# Patient Record
Sex: Male | Born: 1960 | Race: White | Hispanic: No | Marital: Married | Smoking: Current every day smoker
Health system: Southern US, Community
[De-identification: ages and names within clinical notes are randomized; demographics above are authoritative.]

## PROBLEM LIST (undated history)

## (undated) DIAGNOSIS — J449 Chronic obstructive pulmonary disease, unspecified: Secondary | ICD-10-CM

## (undated) DIAGNOSIS — D6851 Activated protein C resistance: Secondary | ICD-10-CM

## (undated) DIAGNOSIS — E785 Hyperlipidemia, unspecified: Secondary | ICD-10-CM

## (undated) DIAGNOSIS — F0150 Vascular dementia without behavioral disturbance: Secondary | ICD-10-CM

## (undated) DIAGNOSIS — I10 Essential (primary) hypertension: Secondary | ICD-10-CM

## (undated) DIAGNOSIS — F418 Other specified anxiety disorders: Secondary | ICD-10-CM

## (undated) DIAGNOSIS — I739 Peripheral vascular disease, unspecified: Secondary | ICD-10-CM

## (undated) DIAGNOSIS — R569 Unspecified convulsions: Secondary | ICD-10-CM

## (undated) HISTORY — PX: BACK SURGERY: SHX140

## (undated) HISTORY — PX: LEG SURGERY: SHX1003

---

## 2008-08-11 ENCOUNTER — Ambulatory Visit: Payer: Self-pay | Admitting: Neurosurgery

## 2008-08-20 ENCOUNTER — Ambulatory Visit: Payer: Self-pay | Admitting: Neurosurgery

## 2010-04-08 ENCOUNTER — Ambulatory Visit: Payer: Self-pay | Admitting: Hematology and Oncology

## 2010-12-13 ENCOUNTER — Ambulatory Visit: Payer: Self-pay | Admitting: Orthopedic Surgery

## 2010-12-26 ENCOUNTER — Ambulatory Visit: Payer: Self-pay | Admitting: Hematology and Oncology

## 2014-02-09 ENCOUNTER — Ambulatory Visit: Payer: Self-pay | Admitting: Orthopedic Surgery

## 2014-02-13 DIAGNOSIS — S32000A Wedge compression fracture of unspecified lumbar vertebra, initial encounter for closed fracture: Secondary | ICD-10-CM | POA: Insufficient documentation

## 2014-02-13 DIAGNOSIS — S22000A Wedge compression fracture of unspecified thoracic vertebra, initial encounter for closed fracture: Secondary | ICD-10-CM | POA: Insufficient documentation

## 2014-02-13 DIAGNOSIS — M546 Pain in thoracic spine: Secondary | ICD-10-CM | POA: Insufficient documentation

## 2014-03-02 ENCOUNTER — Ambulatory Visit: Payer: Self-pay | Admitting: Orthopedic Surgery

## 2017-01-01 ENCOUNTER — Emergency Department
Admission: EM | Admit: 2017-01-01 | Discharge: 2017-01-02 | Disposition: A | Discharge status: Home / Self Care | Payer: BLUE CROSS/BLUE SHIELD | Attending: Emergency Medicine | Admitting: Emergency Medicine

## 2017-01-01 DIAGNOSIS — F23 Brief psychotic disorder: Secondary | ICD-10-CM

## 2017-01-01 DIAGNOSIS — I10 Essential (primary) hypertension: Secondary | ICD-10-CM | POA: Diagnosis not present

## 2017-01-01 DIAGNOSIS — F19959 Other psychoactive substance use, unspecified with psychoactive substance-induced psychotic disorder, unspecified: Secondary | ICD-10-CM

## 2017-01-01 DIAGNOSIS — Z7901 Long term (current) use of anticoagulants: Secondary | ICD-10-CM | POA: Insufficient documentation

## 2017-01-01 DIAGNOSIS — F17200 Nicotine dependence, unspecified, uncomplicated: Secondary | ICD-10-CM | POA: Insufficient documentation

## 2017-01-01 DIAGNOSIS — Z79899 Other long term (current) drug therapy: Secondary | ICD-10-CM | POA: Insufficient documentation

## 2017-01-01 DIAGNOSIS — E871 Hypo-osmolality and hyponatremia: Secondary | ICD-10-CM | POA: Insufficient documentation

## 2017-01-01 DIAGNOSIS — R410 Disorientation, unspecified: Secondary | ICD-10-CM | POA: Insufficient documentation

## 2017-01-01 DIAGNOSIS — F29 Unspecified psychosis not due to a substance or known physiological condition: Secondary | ICD-10-CM | POA: Diagnosis not present

## 2017-01-01 DIAGNOSIS — J449 Chronic obstructive pulmonary disease, unspecified: Secondary | ICD-10-CM | POA: Insufficient documentation

## 2017-01-01 DIAGNOSIS — R4182 Altered mental status, unspecified: Secondary | ICD-10-CM | POA: Diagnosis present

## 2017-01-02 ENCOUNTER — Emergency Department: Payer: BLUE CROSS/BLUE SHIELD

## 2017-01-02 DIAGNOSIS — F19959 Other psychoactive substance use, unspecified with psychoactive substance-induced psychotic disorder, unspecified: Secondary | ICD-10-CM

## 2017-01-02 LAB — URINE DRUG SCREEN, QUALITATIVE (ARMC ONLY)
Amphetamines, Ur Screen: NOT DETECTED
Barbiturates, Ur Screen: NOT DETECTED
Benzodiazepine, Ur Scrn: POSITIVE — AB
Cannabinoid 50 Ng, Ur ~~LOC~~: NOT DETECTED
Cocaine Metabolite,Ur ~~LOC~~: NOT DETECTED
MDMA (Ecstasy)Ur Screen: NOT DETECTED
Methadone Scn, Ur: NOT DETECTED
Opiate, Ur Screen: NOT DETECTED
Phencyclidine (PCP) Ur S: NOT DETECTED
Tricyclic, Ur Screen: NOT DETECTED

## 2017-01-02 LAB — ACETAMINOPHEN LEVEL: Acetaminophen (Tylenol), Serum: <10 ug/mL — ABNORMAL LOW (ref 10–30)

## 2017-01-02 LAB — COMPREHENSIVE METABOLIC PANEL
ALT: 10 U/L — ABNORMAL LOW (ref 17–63)
AST: 24 U/L (ref 15–41)
Albumin: 4.5 g/dL (ref 3.5–5.0)
Alkaline Phosphatase: 92 U/L (ref 38–126)
Anion gap: 11 (ref 5–15)
BUN: <5 mg/dL — ABNORMAL LOW (ref 6–20)
CO2: 29 mmol/L (ref 22–32)
Calcium: 9.2 mg/dL (ref 8.9–10.3)
Chloride: 86 mmol/L — ABNORMAL LOW (ref 101–111)
Creatinine, Ser: 0.98 mg/dL (ref 0.61–1.24)
GFR calc Af Amer: >60 mL/min (ref >60)
GFR calc non Af Amer: >60 mL/min (ref >60)
Glucose, Bld: 117 mg/dL — ABNORMAL HIGH (ref 65–99)
Potassium: 3 mmol/L — ABNORMAL LOW (ref 3.5–5.1)
Sodium: 126 mmol/L — ABNORMAL LOW (ref 135–145)
Total Bilirubin: 0.7 mg/dL (ref 0.3–1.2)
Total Protein: 7.8 g/dL (ref 6.5–8.1)

## 2017-01-02 LAB — BASIC METABOLIC PANEL
Anion gap: 11 (ref 5–15)
BUN: <5 mg/dL — ABNORMAL LOW (ref 6–20)
CO2: 30 mmol/L (ref 22–32)
Calcium: 9.8 mg/dL (ref 8.9–10.3)
Chloride: 89 mmol/L — ABNORMAL LOW (ref 101–111)
Creatinine, Ser: 0.95 mg/dL (ref 0.61–1.24)
GFR calc Af Amer: >60 mL/min (ref >60)
GFR calc non Af Amer: >60 mL/min (ref >60)
Glucose, Bld: 122 mg/dL — ABNORMAL HIGH (ref 65–99)
Potassium: 3.2 mmol/L — ABNORMAL LOW (ref 3.5–5.1)
Sodium: 130 mmol/L — ABNORMAL LOW (ref 135–145)

## 2017-01-02 LAB — SALICYLATE LEVEL: Salicylate Lvl: <7 mg/dL (ref 2.8–30.0)

## 2017-01-02 LAB — CBC
HCT: 38.1 % — ABNORMAL LOW (ref 40.0–52.0)
Hemoglobin: 13.2 g/dL (ref 13.0–18.0)
MCH: 30.4 pg (ref 26.0–34.0)
MCHC: 34.8 g/dL (ref 32.0–36.0)
MCV: 87.4 fL (ref 80.0–100.0)
Platelets: 349 10*3/uL (ref 150–440)
RBC: 4.36 MIL/uL — ABNORMAL LOW (ref 4.40–5.90)
RDW: 13 % (ref 11.5–14.5)
WBC: 11.8 10*3/uL — ABNORMAL HIGH (ref 3.8–10.6)

## 2017-01-02 LAB — ETHANOL: Alcohol, Ethyl (B): <5 mg/dL (ref <5)

## 2017-01-02 MED ORDER — CLONAZEPAM 1 MG PO TABS
1.0000 mg | ORAL_TABLET | ORAL | Status: AC
  Administered 2017-01-02: 1 mg via ORAL
  Filled 2017-01-02: qty 1

## 2017-01-02 MED ORDER — HALOPERIDOL LACTATE 5 MG/ML IJ SOLN
5.0000 mg | Freq: Once | INTRAMUSCULAR | Status: AC
  Administered 2017-01-02: 5 mg via INTRAMUSCULAR
  Filled 2017-01-02: qty 1

## 2017-01-02 NOTE — ED Notes (Signed)
SOC  DONE  REPORT  GIVEN  TO DR  Shaune PollackLORD

## 2017-01-02 NOTE — ED Notes (Signed)
BEHAVIORAL HEALTH ROUNDING Patient sleeping: No. Patient alert and oriented: yes Behavior appropriate: Yes.  ; If no, describe:  Nutrition and fluids offered: yes Toileting and hygiene offered: Yes  Sitter present: q15 minute observations and security  monitoring Law enforcement present: Yes  ODS  

## 2017-01-02 NOTE — ED Notes (Signed)
Pt ambulated to bathroom unassisted. Pt covered stomach area with pillow while walking and stated he could not walk without it due to his ribs hurting. RN notified.

## 2017-01-02 NOTE — ED Notes (Signed)
Pt given lunch tray. Pt said he cannot eat. Pt states he needs magic mouthwash, efferdent and another pillow. Pt states he don't care if his ribs are broken or not, they hurt.

## 2017-01-02 NOTE — Discharge Instructions (Signed)
°  As we have discussed, please follow up with your primary care doctor as soon as possible regarding today?s Emergency Department (ED) visit and your confusion symptoms.   They are better now, but were likely due to a low sodium level. Please have your sodium level rechecked in 2-3 days by your regular doctor or return to the ER or urgent care to have this done if your primary doctor can not arrange this.  Call your doctor or return to the ED if you have a worsening headache, sudden and severe headache, confusion, slurred speech, facial droop, weakness or numbness in any arm or leg, extreme fatigue, vision problems, or other symptoms that concern you.

## 2017-01-02 NOTE — ED Provider Notes (Signed)
[facility] Emergency Department Provider Note  ____________________________________________  Time seen: Approximately 4:42 AM  I have reviewed the triage vital signs and the nursing notes.   HISTORY  Chief Complaint Altered Mental Status  Level 5 caveat:  Portions of the history and physical were unable to be obtained due to: Poor historian   HPI Cory Moses is a 55 y.o. male brought to the ED under involuntary commitment due to reported suicidal ideation and visual hallucinations. Patient denies these but does make frequent references to religion. He also is spouse's various beliefs about his daughter or son trying to sheet him and plotting against him. His thinking is tangential and difficult to engage in a continuous conversation     History reviewed. No pertinent past medical history. Hypertension Stroke Seizure Factor V Leiden COPD Left lower extremity DVT  There are no active problems to display for this patient.    History reviewed. No pertinent surgical history. Back surgery  Prior to Admission medications   Not on File  Current Medications   Prescription Sig. Disp. Refills Start Date End Date Status  albuterol (PROVENTIL) 2.5 mg /3 mL (0.083 %) nebulizer solution     11/15/2012  Active  ALPRAZolam (XANAX) 1 MG tablet     12/16/2012  Active  amLODIPine (NORVASC) 5 MG tablet     09/30/2012  Active  clonazePAM (KLONOPIN) 2 MG tablet     12/16/2012  Active  NEXIUM 40 mg DR capsule     09/30/2012  Active  lisinopril (PRINIVIL,ZESTRIL) 20 MG tablet     11/07/2012  Active  OXYCONTIN 40 mg CR tablet     12/16/2012  Active  warfarin (COUMADIN) 4 MG tablet     12/06/2012  Active  gabapentin (NEURONTIN) 300 MG capsule  Take 300 mg by mouth 2 (two) times daily.     Active  promethazine (PHENERGAN) 25 MG tablet    0 01/13/2014  Active  hydrocodone-chlorpheniramine (TUSSIONEX) 10-8 mg/5  mL ER suspension    0 11/18/2013  Active  oxyCODONE (ROXICODONE) 15 MG immediate release tablet    0 01/16/2014  Active  OXYGEN-AIR DELIVERY SYSTEMS MISC  Use 2 L/min.       Patient reports he's not longer taking warfarin and takes Eliquis instead     Allergies Morphine and related   No family history on file.  Social History Social History  Substance Use Topics  . Smoking status: Current Every Day Smoker  . Smokeless tobacco: Never Used  . Alcohol use No    Review of Systems  Constitutional:   No fever or chills.  ENT:   No sore throat. No rhinorrhea. Cardiovascular:   No chest pain or syncope. Respiratory:   No dyspnea or cough. Gastrointestinal:   Negative for abdominal pain, vomiting and diarrhea.  Musculoskeletal:   Negative for focal pain or swelling All other systems reviewed and are negative except as documented above in ROS and HPI.  ____________________________________________   PHYSICAL EXAM:  VITAL SIGNS: ED Triage Vitals  Enc Vitals Group     BP 01/02/17 0023 (!) 145/106     Pulse Rate 01/02/17 0023 84     Resp 01/02/17 0023 18     Temp 01/02/17 0023 98.2 F (36.8 C)     Temp Source 01/02/17 0023 Oral     SpO2 01/02/17 0023 96 %     Weight 01/01/17 2359 155 lb (70.3 kg)     Height 01/01/17 2359 5\' 6"  (1.676  m)     Head Circumference --      Peak Flow --      Pain Score --      Pain Loc --      Pain Edu? --      Excl. in GC? --     Vital signs reviewed, nursing assessments reviewed.   Constitutional:   Alert and oriented. Well appearing and in no distress. Eyes:   No scleral icterus.  EOMI. No nystagmus. No conjunctival pallor. PERRL. ENT   Head:   Normocephalic and atraumatic.   Nose:   No congestion/rhinnorhea.    Mouth/Throat:   MMM, no pharyngeal erythema. No peritonsillar mass.    Neck:   No meningismus. Full ROM Hematological/Lymphatic/Immunilogical:   No cervical lymphadenopathy. Cardiovascular:   RRR.  Symmetric bilateral radial and DP pulses.  No murmurs.  Respiratory:   Normal respiratory effort without tachypnea/retractions. Breath sounds are clear and equal bilaterally. No wheezes/rales/rhonchi. Gastrointestinal:   Soft and nontender. Non distended. There is no CVA tenderness.  No rebound, rigidity, or guarding. Genitourinary:   deferred Musculoskeletal:   Normal range of motion in all extremities. No joint effusions.  No lower extremity tenderness.  No edema. Neurologic:   Normal speech and language.  Motor grossly intact. No gross focal neurologic deficits are appreciated.  Skin:    Skin is warm, dry and intact. No rash noted.  No petechiae, purpura, or bullae.  ____________________________________________    LABS (pertinent positives/negatives) (all labs ordered are listed, but only abnormal results are displayed) Labs Reviewed  COMPREHENSIVE METABOLIC PANEL - Abnormal; Notable for the following:       Result Value   Sodium 126 (*)    Potassium 3.0 (*)    Chloride 86 (*)    Glucose, Bld 117 (*)    BUN <5 (*)    ALT 10 (*)    All other components within normal limits  ACETAMINOPHEN LEVEL - Abnormal; Notable for the following:    Acetaminophen (Tylenol), Serum <10 (*)    All other components within normal limits  CBC - Abnormal; Notable for the following:    WBC 11.8 (*)    RBC 4.36 (*)    HCT 38.1 (*)    All other components within normal limits  URINE DRUG SCREEN, QUALITATIVE ([facility] ONLY) - Abnormal; Notable for the following:    Benzodiazepine, Ur Scrn POSITIVE (*)    All other components within normal limits  ETHANOL  SALICYLATE LEVEL   ____________________________________________   EKG    ____________________________________________    RADIOLOGY  No results found.  ____________________________________________   PROCEDURES Procedures  ____________________________________________   INITIAL IMPRESSION / ASSESSMENT AND PLAN / ED  COURSE  Pertinent labs & imaging results that were available during my care of the patient were reviewed by me and considered in my medical decision making (see chart for details).  Patient presents with multiple symptoms of mania, psychosis. Continue IVC pending psychiatry consult. On initial assessment is calm and directable, not requiring any immediate calming agents.  Clinical Course as of Jan 02 441  Tue Jan 02, 2017  0418 Pt increasingly agitated, difficult to redirect. Will give haldol to begin treating his psychosis symptoms pending psych eval.  [PS]    Clinical Course User Index [PS] Sharman CheekStafford, Evin Chirco, MD     ____________________________________________   FINAL CLINICAL IMPRESSION(S) / ED DIAGNOSES  Final diagnoses:  Acute psychosis  Hyponatremia      New Prescriptions   No medications on  file     Portions of this note were generated with dragon dictation software. Dictation errors may occur despite best attempts at proofreading.    Sharman Cheek, MD 01/02/17 [phone]

## 2017-01-02 NOTE — ED Notes (Signed)
Patient is alert and oriented, calm and cooperative and displays appropriate behavior. Nutrition and fluids offered. Toileting and hygiene offered. Sitter is present and rounding every 15 minutes. Security officer is present.   
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
## 2017-01-02 NOTE — ED Notes (Signed)
Pt asked for some food and something to drink; pt provided with a sandwich tray and cola soda.

## 2017-01-02 NOTE — ED Notes (Signed)
SOC    CALLED 

## 2017-01-02 NOTE — ED Notes (Signed)
Patient observed lying in bed with eyes closed  Even, unlabored respirations observed   NAD pt appears to be sleeping  I will continue to monitor along with every 15 minute visual observations and ongoing security monitoring    

## 2017-01-02 NOTE — ED Notes (Signed)
Pt came out of the room, speaking loudly to the security officer present and to this RN; pt is highly agitated, and refusing to go back to his assigned room; pt says "I'm going to sue everyone of you m------f------"; pt was escorted into his room with the security guards holding the pt's arms and assisted into his room; pt continues to yell loudly from his bed; the garage door in the room has been closed due to safety concerns in the patient's highly agitated state.  Pt does not appear to be short of breath nor does he appear to be hypoxic. When the pt calms down, this RN will recheck the pt's O2 sats.

## 2017-01-02 NOTE — ED Notes (Signed)
Littie Deedsimothy Deery Jr cell phone: [phone]

## 2017-01-02 NOTE — ED Notes (Addendum)
[facility]. Pt wanded and oriented to unit. Pt's speech is tangential and appears moderately anxious. Pt denies SI/HI and A/V hallucinations. Pt given po fluids and blankets because of his complaint of being cold. Pt remains safe with 15 min checks.

## 2017-01-02 NOTE — ED Notes (Signed)
Patient discharged to lobby in care of son, who is providing transportation. Pt stable and appreciative at this time. All papers were given and valuables returned. Verbal understanding expressed. Denies SI/HI and A/VH. Pt given opportunity to express concerns and ask questions.
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
## 2017-01-02 NOTE — ED Notes (Signed)
Xray in room 23 to perform chest xray
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
## 2017-01-02 NOTE — Consult Note (Signed)
[facility] Consult   Reason for Consult:  Consult for 55 year old man without a clear past psychiatric history who was brought here under IVC filed by his family Referring Physician:  Reita Cliche Patient Identification: Cory Moses MRN:  [id number] Principal Diagnosis: Substance-induced psychotic disorder [facility]) Diagnosis:   Patient Active Problem List   Diagnosis Date Noted  . Substance-induced psychotic disorder [facility]) [F19.959] 01/02/2017    Total Time spent with patient: 1 hour  Subjective:   Cory Moses is a 56 y.o. male patient admitted with "my wife just did this to me".  HPI:  Patient interviewed chart reviewed. Commitment paperwork filed by his family reports that he's been having delusions and odd behavior. It was documented in one of the notes from last night that the patient was making some paranoid statements feeling that he was being videotaped but there is no evidence that he was trying to hurt himself. Patient denies that he had made any suicidal or homicidal statements at all. Denies any wish to harm himself. He says his mood is generally pretty good. He describes a lot of chronically chaotic fighting self and his adult daughter and his wife. He says that he does have a little bit of trouble sleeping but on his current medicine it's not too bad. He admits his appetite is poor and that he has been losing weight. Denies any wish to starve himself. Patient has not shown any dangerous behavior since coming into the emergency room. He tells me that he takes Xanax and clonazepam and oxycodone all prescribed by his primary care doctor. At first I could not find any evidence of that in the controlled substance database but when I called the pharmacy directly they confirm that it is true that he takes all of those medicines.  Social history: Lives with his wife. Not working. Seems to have a lot of chaos amidst his adult children himself and his wife.  Medical  history: History of back injury with chronic back pain. Basic labs obtained when he came into the ER were notable for a sodium of 126 of unknown etiology.  Substance abuse history: He tells me that he used to have a drug problem years ago but that he lost his "taste" for it. He denies that he's been drinking or using or abusing any drugs recently.  Past Psychiatric History: No known past psychiatric history. No history of psychiatric hospitalization. No history of suicide attempts or violence. Not sure why he takes so much benzodiazepine.  Risk to Self: Suicidal Ideation: No Suicidal Intent: No Is patient at risk for suicide?: No Suicidal Plan?: No Access to Means: No What has been your use of drugs/alcohol within the last 12 months?: History of alcohol use How many times?: 0 Other Self Harm Risks: denied Triggers for Past Attempts: None known (Patient denied) Intentional Self Injurious Behavior: None Risk to Others: Homicidal Ideation: No Thoughts of Harm to Others: No Current Homicidal Intent: No Current Homicidal Plan: No Access to Homicidal Means: No Identified Victim: None identified History of harm to others?: No Assessment of Violence: None Noted Violent Behavior Description: denied Does patient have access to weapons?: No Criminal Charges Pending?: No Does patient have a court date: No Prior Inpatient Therapy: Prior Inpatient Therapy: No Prior Therapy Dates: n/a Prior Therapy Facilty/Provider(s): n/a Reason for Treatment: n/a Prior Outpatient Therapy: Prior Outpatient Therapy: No Prior Therapy Dates: n/a Prior Therapy Facilty/Provider(s): n/a Reason for Treatment: n/a Does patient have an ACCT team?: No Does  patient have Intensive In-House Services?  : No Does patient have [facility] services? : No Does patient have P4CC services?: No  Past Medical History: History reviewed. No pertinent past medical history. History reviewed. No pertinent surgical history. Family  History: No family history on file. Family Psychiatric  History: Denies knowing of any Social History:  History  Alcohol Use No     History  Drug Use No    Social History   Social History  . Marital status: Married    Spouse name: N/A  . Number of children: N/A  . Years of education: N/A   Social History Main Topics  . Smoking status: Current Every Day Smoker  . Smokeless tobacco: Never Used  . Alcohol use No  . Drug use: No  . Sexual activity: No   Other Topics Concern  . None   Social History Narrative  . None   Additional Social History:    Allergies:   Allergies  Allergen Reactions  . Morphine And Related Anxiety    Labs:  Results for orders placed or performed during the hospital encounter of 01/01/17 (from the past 48 hour(s))  Comprehensive metabolic panel     Status: Abnormal   Collection Time: 01/02/17 12:05 AM  Result Value Ref Range   Sodium 126 (L) 135 - 145 mmol/L   Potassium 3.0 (L) 3.5 - 5.1 mmol/L   Chloride 86 (L) 101 - 111 mmol/L   CO2 29 22 - 32 mmol/L   Glucose, Bld 117 (H) 65 - 99 mg/dL   BUN <5 (L) 6 - 20 mg/dL   Creatinine, Ser 0.98 0.61 - 1.24 mg/dL   Calcium 9.2 8.9 - 10.3 mg/dL   Total Protein 7.8 6.5 - 8.1 g/dL   Albumin 4.5 3.5 - 5.0 g/dL   AST 24 15 - 41 U/L   ALT 10 (L) 17 - 63 U/L   Alkaline Phosphatase 92 38 - 126 U/L   Total Bilirubin 0.7 0.3 - 1.2 mg/dL   GFR calc non Af Amer >60 >60 mL/min   GFR calc Af Amer >60 >60 mL/min    Comment: (NOTE) The eGFR has been calculated using the CKD EPI equation. This calculation has not been validated in all clinical situations. eGFR's persistently <60 mL/min signify possible Chronic Kidney Disease.    Anion gap 11 5 - 15  Ethanol     Status: None   Collection Time: 01/02/17 12:05 AM  Result Value Ref Range   Alcohol, Ethyl (B) <5 <5 mg/dL    Comment:        LOWEST DETECTABLE LIMIT FOR SERUM ALCOHOL IS 5 mg/dL FOR MEDICAL PURPOSES ONLY   Salicylate level     Status: None     Collection Time: 01/02/17 12:05 AM  Result Value Ref Range   Salicylate Lvl <4.7 2.8 - 30.0 mg/dL  Acetaminophen level     Status: Abnormal   Collection Time: 01/02/17 12:05 AM  Result Value Ref Range   Acetaminophen (Tylenol), Serum <10 (L) 10 - 30 ug/mL    Comment:        THERAPEUTIC CONCENTRATIONS VARY SIGNIFICANTLY. A RANGE OF 10-30 ug/mL MAY BE AN EFFECTIVE CONCENTRATION FOR MANY PATIENTS. HOWEVER, SOME ARE BEST TREATED AT CONCENTRATIONS OUTSIDE THIS RANGE. ACETAMINOPHEN CONCENTRATIONS >150 ug/mL AT 4 HOURS AFTER INGESTION AND >50 ug/mL AT 12 HOURS AFTER INGESTION ARE OFTEN ASSOCIATED WITH TOXIC REACTIONS.   cbc     Status: Abnormal   Collection Time: 01/02/17 12:05 AM  Result  Value Ref Range   WBC 11.8 (H) 3.8 - 10.6 K/uL   RBC 4.36 (L) 4.40 - 5.90 MIL/uL   Hemoglobin 13.2 13.0 - 18.0 g/dL   HCT 38.1 (L) 40.0 - 52.0 %   MCV 87.4 80.0 - 100.0 fL   MCH 30.4 26.0 - 34.0 pg   MCHC 34.8 32.0 - 36.0 g/dL   RDW 13.0 11.5 - 14.5 %   Platelets 349 150 - 440 K/uL  Urine Drug Screen, Qualitative     Status: Abnormal   Collection Time: 01/02/17  2:08 AM  Result Value Ref Range   Tricyclic, Ur Screen NONE DETECTED NONE DETECTED   Amphetamines, Ur Screen NONE DETECTED NONE DETECTED   MDMA (Ecstasy)Ur Screen NONE DETECTED NONE DETECTED   Cocaine Metabolite,Ur [place] NONE DETECTED NONE DETECTED   Opiate, Ur Screen NONE DETECTED NONE DETECTED   Phencyclidine (PCP) Ur S NONE DETECTED NONE DETECTED   Cannabinoid 50 Ng, Ur [place] NONE DETECTED NONE DETECTED   Barbiturates, Ur Screen NONE DETECTED NONE DETECTED   Benzodiazepine, Ur Scrn POSITIVE (A) NONE DETECTED   Methadone Scn, Ur NONE DETECTED NONE DETECTED    Comment: (NOTE) 947  Tricyclics, urine               Cutoff 1000 ng/mL 200  Amphetamines, urine             Cutoff 1000 ng/mL 300  MDMA (Ecstasy), urine           Cutoff 500 ng/mL 400  Cocaine Metabolite, urine       Cutoff 300 ng/mL 500  Opiate, urine                    Cutoff 300 ng/mL 600  Phencyclidine (PCP), urine      Cutoff 25 ng/mL 700  Cannabinoid, urine              Cutoff 50 ng/mL 800  Barbiturates, urine             Cutoff 200 ng/mL 900  Benzodiazepine, urine           Cutoff 200 ng/mL 1000 Methadone, urine                Cutoff 300 ng/mL 1100 1200 The urine drug screen provides only a preliminary, unconfirmed 1300 analytical test result and should not be used for non-medical 1400 purposes. Clinical consideration and professional judgment should 1500 be applied to any positive drug screen result due to possible 1600 interfering substances. A more specific alternate chemical method 1700 must be used in order to obtain a confirmed analytical result.  1800 Gas chromato graphy / mass spectrometry (GC/MS) is the preferred 1900 confirmatory method.     No current facility-administered medications for this encounter.    Current Outpatient Prescriptions  Medication Sig Dispense Refill  . albuterol (PROVENTIL HFA;VENTOLIN HFA) 108 (90 Base) MCG/ACT inhaler Inhale into the lungs every 6 (six) hours as needed for wheezing or shortness of breath.    . ALPRAZolam (XANAX) 1 MG tablet Take 1 mg by mouth 3 (three) times daily as needed for anxiety.    Marland Kitchen amLODipine (NORVASC) 5 MG tablet Take 5 mg by mouth daily.    . clonazePAM (KLONOPIN) 2 MG tablet Take 2 mg by mouth 3 (three) times daily as needed for anxiety.    Marland Kitchen esomeprazole (NEXIUM) 40 MG capsule Take 40 mg by mouth daily at 12 noon.    . gabapentin (NEURONTIN) 300 MG  capsule Take 300 mg by mouth 3 (three) times daily.    Marland Kitchen lisinopril (PRINIVIL,ZESTRIL) 20 MG tablet Take 20 mg by mouth daily.    . Multiple Vitamins-Minerals (MENS 50+ MULTI VITAMIN/MIN PO) Take 1 tablet by mouth daily.    Marland Kitchen oxyCODONE (OXYCONTIN) 40 mg 12 hr tablet Take 40 mg by mouth every 12 (twelve) hours.     Marland Kitchen oxyCODONE (ROXICODONE) 15 MG immediate release tablet Take 15 mg by mouth 4 (four) times daily as needed for pain.    .  promethazine (PHENERGAN) 25 MG tablet Take 25 mg by mouth 2 (two) times daily as needed for nausea or vomiting.    Marland Kitchen apixaban (ELIQUIS) 5 MG TABS tablet Take 5 mg by mouth 2 (two) times daily.      Musculoskeletal: Strength & Muscle Tone: decreased Gait & Station: unsteady Patient leans: N/A  Psychiatric Specialty Exam: Physical Exam  Nursing note and vitals reviewed. Constitutional: He appears well-developed.  HENT:  Head: Normocephalic and atraumatic.  Eyes: Conjunctivae are normal. Pupils are equal, round, and reactive to light.  Neck: Normal range of motion.  Cardiovascular: Regular rhythm and normal heart sounds.   Respiratory: Effort normal.  GI: Soft.  Musculoskeletal: Normal range of motion.  Neurological: He is alert.  Skin: Skin is warm and dry.     Psychiatric: His speech is normal. Judgment normal. His affect is blunt. He is slowed. Thought content is not paranoid and not delusional. He expresses no homicidal and no suicidal ideation. He exhibits abnormal recent memory.    Review of Systems  Constitutional: Negative.   HENT: Negative.   Eyes: Negative.   Respiratory: Negative.   Cardiovascular: Negative.   Gastrointestinal: Negative.   Musculoskeletal: Negative.   Skin: Negative.   Neurological: Negative.   Psychiatric/Behavioral: Negative.     Blood pressure 130/68, pulse 76, temperature 98.2 F (36.8 C), temperature source Oral, resp. rate 20, height 5' 6"  (1.676 m), weight 155 lb (70.3 kg), SpO2 95 %.Body mass index is 25.02 kg/m.  General Appearance: Disheveled  Eye Contact:  Fair  Speech:  Normal Rate  Volume:  Normal  Mood:  Euthymic  Affect:  Constricted  Thought Process:  Goal Directed  Orientation:  Full (Time, Place, and Person)  Thought Content:  Logical  Suicidal Thoughts:  No  Homicidal Thoughts:  No  Memory:  Immediate;   Fair Recent;   Poor Remote;   Good  Judgement:  Fair  Insight:  Fair  Psychomotor Activity:  Decreased    Concentration:  Concentration: Fair  Recall:  [employer] of Knowledge:  Fair  Language:  Fair  Akathisia:  No  Handed:  Right  AIMS (if indicated):     Assets:  Housing Social Support  ADL's:  Intact  Cognition:  Impaired,  Mild  Sleep:        Treatment Plan Summary: Plan 56 year old man without a past psychiatric history. When he came in last night it sounds like there was some evidence of his being slightly more confused. On interview today I did not find the patient to be delirious. No evidence of suicidality or psychosis. Patient does not meet commitment criteria. As I explained to the patient I suspect that if he is having spells of confusion the most likely explanation would be the combination of 2 different benzodiazepines as well as what sounds like 2 different narcotic medicines. Explained to him the serious consequences of miss use of these kinds of medicines or even regular  use of the combination. Additionally he has a sodium of 126 which could've made him more confused. Patient does not meet commitment criteria. Discontinue IVC. Reorder lab studies which the emergency room doctor will follow-up on. Patient follows up for his usual medicine with his primary care doctor.  Disposition: Patient does not meet criteria for psychiatric inpatient admission. Supportive therapy provided about ongoing stressors.  Alethia Berthold, MD 01/02/2017 4:27 PM

## 2017-01-02 NOTE — ED Notes (Signed)
Dr. Clapacs in with patient 

## 2017-01-02 NOTE — BH Assessment (Signed)
Clinician received phone call from pt's daughter Neysa Bonito(Christy) stating she was transferred from the ED and wanted to request visitation restrictions (pt's son). Clinician explained to caller that she would need to speak with the pt's RN in reference to visitation and any restriction requests. Clinician contacted ED secretary Lewie Loron(Emilie) to explain that visitation restrictions are not within TTS scope of practice and should be directed to pt's RN. Clinician also contacted pt's RN (Amy) to state the same and inform her that she would be instructing family member to contact RN once again.

## 2017-01-02 NOTE — BH Assessment (Signed)
Per Dr.Clapacs pt recommended for d/c upon medical clearance.

## 2017-01-02 NOTE — ED Notes (Signed)

## 2017-01-02 NOTE — ED Notes (Signed)
TTS reports that pt's states he uses 2L O2 via [place] at night at home; pt placed on O2 at this time.

## 2017-01-02 NOTE — ED Notes (Signed)
Pt given breakfast tray. Pt stated he could not eat and took his teeth out.

## 2017-01-02 NOTE — ED Provider Notes (Signed)
Note the patient's sodium 126  Discussed with the patient, and reports been told he had low potassium in the past. He denies any symptoms except for chronic pain in his back and that he takes Klonopin regularly and is beginning to feel anxious as he has not had his Klonopin dose recently. His prescriptions were called and confirmed by Dr. Toni Amendlapacs of psychiatry and he does take Klonopin as well as oxycodone at home.  I am concerned about his sodium being as low as it is, his altered mental status may be contributed to by this. We will repeat his sodium here to confirm, but he is alert, well oriented, pleasant with no signs of confusion at this time.  ----------------------------------------- 5:20 PM on 01/02/2017 -----------------------------------------  Sodium has improved to 130. Patient's mental status is improved. He is now well oriented, no signs of ongoing confusion or delirium.  We'll discharge the patient home, advised follow-up in 2-3 days for recheck of sodium and careful return precautions.  Return precautions and treatment recommendations and follow-up discussed with the patient who is agreeable with the plan.      Sharyn CreamerQuale, Dotti Busey, MD 01/02/17 1901

## 2017-01-02 NOTE — ED Notes (Signed)
[facility]

## 2017-01-02 NOTE — ED Triage Notes (Signed)
Pt arrives via [facility] under IVC with c/o SI and visual hallucinations. IVC paperwork reports pt is a danger to self and has been having visual hallucinations of seeing angels and demons. Pt states he is only here because "my wife is telling lies on me".

## 2017-01-02 NOTE — ED Notes (Signed)
[place]:  [facility] [place] WIFE [address](MARY Thornell MuleK. [place]) [phone]

## 2017-01-02 NOTE — ED Notes (Signed)
Patient is asleep on the stretcher, rise and fall of chest noted; patient appears to be comfortable and does not appear to be in distress at this time. Sitter is present and rounding every 15 minutes to ensure safety. Law enforcement is present. No fluids or meals offered at this time. No toiletting offered at this time.  

## 2017-01-02 NOTE — ED Notes (Signed)
Received a call from someone stating they are his daughter - I listened -- daughter stating  "his son is stealing his medicine - they are both taking the medicine and that is what has him so messed up."  I reported that I cannot give out any information - she verbalized knowing that I cannot  Phone times and visiting times advised  "I really wish that his on could not visit - cause he will sneak something in there to him."  I reported that all visitors are encouraged to leave their belongings in the car and all visitors are checked /wanded by security prior to visiting and all belongings are taken  She verbalized that she will call back at 0900

## 2017-01-02 NOTE — ED Notes (Signed)
ED  Is the patient under IVC or is there intent for IVC: Yes.   Is the patient medically cleared: Yes.   Is there vacancy in the ED BHU: Yes.   Is the population mix appropriate for patient: Yes.   Is the patient awaiting placement in inpatient or outpatient setting:  Has the patient had a psychiatric consult:  SOC pending  Survey of unit performed for contraband, proper placement and condition of furniture, tampering with fixtures in bathroom, shower, and each patient room: Yes.  ; Findings:  APPEARANCE/BEHAVIOR Calm and cooperative NEURO ASSESSMENT Orientation: oriented x3  Denies pain Hallucinations: No.None noted (Hallucinations) Speech: Normal Gait: normal RESPIRATORY ASSESSMENT Even  Unlabored respirations  CARDIOVASCULAR ASSESSMENT Pulses equal   regular rate  Skin warm and dry   GASTROINTESTINAL ASSESSMENT no GI complaint EXTREMITIES Full ROM  PLAN OF CARE Provide calm/safe environment. Vital signs assessed twice daily. ED BHU Assessment once each 12-hour shift. Collaborate with TTS daily or as condition indicates. Assure the ED provider has rounded once each shift. Provide and encourage hygiene. Provide redirection as needed. Assess for escalating behavior; address immediately and inform ED provider.  Assess family dynamic and appropriateness for visitation as needed: Yes.  ; If necessary, describe findings:  Educate the patient/family about BHU procedures/visitation: Yes.  ; If necessary, describe findings:

## 2017-01-02 NOTE — ED Notes (Addendum)
Patient's son,Clell, called to inquire about his father. Left his number [phone] and his wife's number,Kimberly, [phone]

## 2017-01-02 NOTE — ED Notes (Addendum)
Roseanne RenoKristy Skowron [phone] (daughter).

## 2017-12-09 DIAGNOSIS — R569 Unspecified convulsions: Secondary | ICD-10-CM | POA: Insufficient documentation

## 2017-12-09 DIAGNOSIS — F13939 Sedative, hypnotic or anxiolytic use, unspecified with withdrawal, unspecified: Secondary | ICD-10-CM | POA: Insufficient documentation

## 2017-12-10 DIAGNOSIS — Z7901 Long term (current) use of anticoagulants: Secondary | ICD-10-CM | POA: Insufficient documentation

## 2017-12-10 DIAGNOSIS — E46 Unspecified protein-calorie malnutrition: Secondary | ICD-10-CM | POA: Insufficient documentation

## 2017-12-10 DIAGNOSIS — F1120 Opioid dependence, uncomplicated: Secondary | ICD-10-CM | POA: Insufficient documentation

## 2017-12-10 DIAGNOSIS — J449 Chronic obstructive pulmonary disease, unspecified: Secondary | ICD-10-CM | POA: Insufficient documentation

## 2018-01-24 DIAGNOSIS — D6851 Activated protein C resistance: Secondary | ICD-10-CM | POA: Insufficient documentation

## 2018-01-24 DIAGNOSIS — G8921 Chronic pain due to trauma: Secondary | ICD-10-CM | POA: Insufficient documentation

## 2018-04-29 DIAGNOSIS — Z961 Presence of intraocular lens: Secondary | ICD-10-CM | POA: Insufficient documentation

## 2019-06-07 DIAGNOSIS — I10 Essential (primary) hypertension: Secondary | ICD-10-CM

## 2019-08-12 DIAGNOSIS — S72001A Fracture of unspecified part of neck of right femur, initial encounter for closed fracture: Secondary | ICD-10-CM | POA: Insufficient documentation

## 2019-09-14 DIAGNOSIS — M810 Age-related osteoporosis without current pathological fracture: Secondary | ICD-10-CM | POA: Insufficient documentation

## 2020-11-24 ENCOUNTER — Other Ambulatory Visit: Payer: Self-pay

## 2020-11-24 ENCOUNTER — Emergency Department: Payer: Medicare Other

## 2020-11-24 ENCOUNTER — Emergency Department
Admission: EM | Admit: 2020-11-24 | Discharge: 2020-11-25 | Disposition: A | Discharge status: Home / Self Care | Payer: Medicare Other | Attending: Emergency Medicine | Admitting: Emergency Medicine

## 2020-11-24 ENCOUNTER — Encounter: Payer: Self-pay | Admitting: Emergency Medicine

## 2020-11-24 DIAGNOSIS — F0390 Unspecified dementia without behavioral disturbance: Secondary | ICD-10-CM | POA: Diagnosis not present

## 2020-11-24 DIAGNOSIS — F17200 Nicotine dependence, unspecified, uncomplicated: Secondary | ICD-10-CM | POA: Insufficient documentation

## 2020-11-24 DIAGNOSIS — Z20822 Contact with and (suspected) exposure to covid-19: Secondary | ICD-10-CM | POA: Insufficient documentation

## 2020-11-24 DIAGNOSIS — I10 Essential (primary) hypertension: Secondary | ICD-10-CM

## 2020-11-24 DIAGNOSIS — Z79899 Other long term (current) drug therapy: Secondary | ICD-10-CM | POA: Insufficient documentation

## 2020-11-24 DIAGNOSIS — R451 Restlessness and agitation: Secondary | ICD-10-CM | POA: Diagnosis not present

## 2020-11-24 DIAGNOSIS — F29 Unspecified psychosis not due to a substance or known physiological condition: Secondary | ICD-10-CM | POA: Diagnosis present

## 2020-11-24 DIAGNOSIS — F22 Delusional disorders: Secondary | ICD-10-CM | POA: Insufficient documentation

## 2020-11-24 DIAGNOSIS — F419 Anxiety disorder, unspecified: Secondary | ICD-10-CM

## 2020-11-24 DIAGNOSIS — J449 Chronic obstructive pulmonary disease, unspecified: Secondary | ICD-10-CM | POA: Diagnosis not present

## 2020-11-24 HISTORY — DX: Unspecified convulsions: R56.9

## 2020-11-24 HISTORY — DX: Vascular dementia, unspecified severity, without behavioral disturbance, psychotic disturbance, mood disturbance, and anxiety: F01.50

## 2020-11-24 HISTORY — DX: Chronic obstructive pulmonary disease, unspecified: J44.9

## 2020-11-24 LAB — CBC
HCT: 36.7 % — ABNORMAL LOW (ref 39.0–52.0)
Hemoglobin: 12.1 g/dL — ABNORMAL LOW (ref 13.0–17.0)
MCH: 28.4 pg (ref 26.0–34.0)
MCHC: 33 g/dL (ref 30.0–36.0)
MCV: 86.2 fL (ref 80.0–100.0)
Platelets: 339 10*3/uL (ref 150–400)
RBC: 4.26 MIL/uL (ref 4.22–5.81)
RDW: 13.2 % (ref 11.5–15.5)
WBC: 6.8 10*3/uL (ref 4.0–10.5)
nRBC: 0 % (ref 0.0–0.2)

## 2020-11-24 LAB — COMPREHENSIVE METABOLIC PANEL
ALT: 20 U/L (ref 0–44)
AST: 48 U/L — ABNORMAL HIGH (ref 15–41)
Albumin: 4.2 g/dL (ref 3.5–5.0)
Alkaline Phosphatase: 115 U/L (ref 38–126)
Anion gap: 11 (ref 5–15)
BUN: 13 mg/dL (ref 8–23)
CO2: 25 mmol/L (ref 22–32)
Calcium: 9 mg/dL (ref 8.9–10.3)
Chloride: 99 mmol/L (ref 98–111)
Creatinine, Ser: 1.23 mg/dL (ref 0.61–1.24)
GFR, Estimated: >60 mL/min (ref >60)
Glucose, Bld: 129 mg/dL — ABNORMAL HIGH (ref 70–99)
Potassium: 4 mmol/L (ref 3.5–5.1)
Sodium: 135 mmol/L (ref 135–145)
Total Bilirubin: 0.5 mg/dL (ref 0.3–1.2)
Total Protein: 8.1 g/dL (ref 6.5–8.1)

## 2020-11-24 LAB — URINE DRUG SCREEN, QUALITATIVE (ARMC ONLY)
Amphetamines, Ur Screen: NOT DETECTED
Barbiturates, Ur Screen: NOT DETECTED
Benzodiazepine, Ur Scrn: POSITIVE — AB
Cannabinoid 50 Ng, Ur ~~LOC~~: NOT DETECTED
Cocaine Metabolite,Ur ~~LOC~~: NOT DETECTED
MDMA (Ecstasy)Ur Screen: NOT DETECTED
Methadone Scn, Ur: POSITIVE — AB
Opiate, Ur Screen: NOT DETECTED
Phencyclidine (PCP) Ur S: NOT DETECTED
Tricyclic, Ur Screen: NOT DETECTED

## 2020-11-24 LAB — ACETAMINOPHEN LEVEL: Acetaminophen (Tylenol), Serum: <10 ug/mL — ABNORMAL LOW (ref 10–30)

## 2020-11-24 LAB — SALICYLATE LEVEL: Salicylate Lvl: <7 mg/dL — ABNORMAL LOW (ref 7.0–30.0)

## 2020-11-24 LAB — ETHANOL: Alcohol, Ethyl (B): <10 mg/dL (ref <10)

## 2020-11-24 MED ORDER — CLONAZEPAM 1 MG PO TABS
2.0000 mg | ORAL_TABLET | Freq: Three times a day (TID) | ORAL | Status: DC
  Administered 2020-11-24 – 2020-11-25 (×3): 2 mg via ORAL
  Filled 2020-11-24: qty 2
  Filled 2020-11-24 (×2): qty 4

## 2020-11-24 MED ORDER — CLONAZEPAM 0.5 MG PO TABS: 2.0000 mg | ORAL_TABLET | Freq: Three times a day (TID) | ORAL | Status: DC

## 2020-11-24 MED ORDER — METHADONE HCL 10 MG PO TABS
10.0000 mg | ORAL_TABLET | Freq: Three times a day (TID) | ORAL | Status: DC
Start: 2020-11-24 — End: 2020-11-25
  Administered 2020-11-24 – 2020-11-25 (×3): 10 mg via ORAL
  Filled 2020-11-24 (×4): qty 1

## 2020-11-24 MED ORDER — PANTOPRAZOLE SODIUM 40 MG PO TBEC
40.0000 mg | DELAYED_RELEASE_TABLET | Freq: Every day | ORAL | Status: DC
  Administered 2020-11-24 – 2020-11-25 (×2): 40 mg via ORAL
  Filled 2020-11-24 (×2): qty 1

## 2020-11-24 MED ORDER — LISINOPRIL 20 MG PO TABS
20.0000 mg | ORAL_TABLET | Freq: Every day | ORAL | Status: DC
  Administered 2020-11-24 – 2020-11-25 (×2): 20 mg via ORAL
  Filled 2020-11-24: qty 1
  Filled 2020-11-24: qty 2

## 2020-11-24 NOTE — ED Notes (Signed)
Pt wanting water, informed will need to wait until CT scan is read

## 2020-11-24 NOTE — ED Notes (Signed)
covid swab collected and sent to lab.

## 2020-11-24 NOTE — ED Notes (Signed)
Hourly rounding performed, patient currently awake in hallway bed. Patient has no complaints at this time. Q15 minute rounds and monitoring via Rover and Officer to continue. 
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
## 2020-11-24 NOTE — ED Notes (Signed)
Phone starting ringing and pt answered, "hello this is security". Pt asked who he was speaking with and said, "I don't know it just rang and I answered". Retrieved phone from pt at this time.
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
## 2020-11-24 NOTE — ED Notes (Signed)
Pt given dinner tray and juice, but refused dinner tray at this time.
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
## 2020-11-24 NOTE — ED Notes (Signed)
Psychiatry at bedside.
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
## 2020-11-24 NOTE — ED Notes (Signed)
IVC pending placement 

## 2020-11-24 NOTE — ED Notes (Signed)
Report received from Gracie, RN including SBAR. Patient alert and oriented, warm and dry, and in no acute distress. Patient denies SI, HI, AVH and pain. Patient made aware of Q15 minute rounds and Rover and Officer presence for their safety. Patient instructed to come to this nurse with needs or concerns. 

## 2020-11-24 NOTE — ED Notes (Signed)
Pt given phone for use.

## 2020-11-24 NOTE — ED Notes (Signed)
Patient Items:  Blue Plaid Pants [employer] Socks  White T-Shirt [employer] Cell-Phone
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
## 2020-11-25 LAB — HEMOGLOBIN A1C
Hgb A1c MFr Bld: 5.5 % (ref 4.8–5.6)
Mean Plasma Glucose: 111 mg/dL

## 2020-11-25 LAB — SARS CORONAVIRUS 2 (TAT 6-24 HRS): SARS Coronavirus 2: NEGATIVE

## 2020-11-25 MED ORDER — CLONAZEPAM 1 MG PO TABS: 2.0000 mg | ORAL_TABLET | Freq: Three times a day (TID) | ORAL | Status: DC | PRN

## 2020-11-25 MED ORDER — PANTOPRAZOLE SODIUM 40 MG PO TBEC: 40.0000 mg | DELAYED_RELEASE_TABLET | Freq: Every day | ORAL | Status: DC

## 2020-11-25 MED ORDER — APIXABAN 5 MG PO TABS
5.0000 mg | ORAL_TABLET | Freq: Two times a day (BID) | ORAL | Status: DC
  Administered 2020-11-25: 5 mg via ORAL
  Filled 2020-11-25 (×2): qty 1

## 2020-11-25 MED ORDER — GABAPENTIN 300 MG PO CAPS: 300.0000 mg | ORAL_CAPSULE | Freq: Every day | ORAL | Status: DC

## 2020-11-25 NOTE — ED Notes (Addendum)
Pt standing at nurse's station door talking.  RN has spoken with patient and told his his discharge plan numerous times.

## 2020-11-25 NOTE — ED Notes (Signed)
IVC, pending placement 

## 2020-11-25 NOTE — ED Notes (Signed)
RN and security walked pt out to his wife for discharge.  Pt's wife stated, "I cannot believe you are discharging him like this.  This is a terrible hospital."

## 2020-11-25 NOTE — TOC Transition Note (Signed)
Transition of Care [facility]) - CM/SW Discharge Note   Patient Details  Name: Cory Moses MRN: [id number] Date of Birth: [birth year]  [facility] CM/SW Contact:  Joseph Art, LCSWA Phone Number: 11/25/2020, 2:47 PM   Clinical Narrative:     Patient will d/c home.  Patient's wife [name], [name] [phone] stated she will pick him up after 4:00PM. EDP/ED Staff updated.        Patient Goals and CMS Choice        Discharge Placement                       Discharge Plan and Services                                     Social Determinants of Health (SDOH) Interventions     Readmission Risk Interventions No flowsheet data found.

## 2020-11-25 NOTE — ED Notes (Signed)
Pt is continually asking for Xanax to keep him from "falling out."   Pt is now convinced he is going to prison and asking about the sim card on his phone.   Pt has been told his wife will be here to bring him home around 4pm.

## 2020-11-25 NOTE — ED Notes (Signed)
Pt insists he needs Xanax or his heart will stop beating like it did at [facility].  Pt did take his other medications after encouragement from RN.  EDP gave permission for medications to be given early.

## 2020-11-25 NOTE — ED Notes (Signed)
EDP made aware of patient's elevated blood pressure.  Per EDP patient still able to discharge home.

## 2020-11-25 NOTE — BH Assessment (Signed)
Referral Checks   (11/25/2020) Cory Moses [phone]), Denied by doctor due to chronic issues such as anxiety and aggression   [facility] ([phone] -or- [phone]) No beds   [facility] ([phone]), No bds   [facility] [phone], [phone], [phone] or [phone]), No beds   [place] [phone] or [phone]), Unable to reach anyone and unable to leave a voicemail message.

## 2020-11-25 NOTE — ED Provider Notes (Signed)
-----------------------------------------   11:49 AM on 11/25/2020 -----------------------------------------  Patient has been seen and evaluated by psychiatry.  They believe the patient is safe for discharge home from psychiatric standpoint.  Patient's medical work-up is been largely nonrevealing besides benzodiazepine and methadone positive urine drug screen.  Patient will be discharged home.  Has outpatient resources.   Minna Antis, MD 11/25/20 1149

## 2020-11-25 NOTE — ED Notes (Signed)
Patient reports that he wears 2L of O2 at home QHS. O2 sats are at 95-96 percent room air. NAD noted. Patient is talking nonstop and is very tangential. Will continue to monitor.

## 2020-11-25 NOTE — ED Notes (Addendum)
Pt refused to take off burgundy scrubs. Charge nurse made aware.

## 2020-11-25 NOTE — ED Notes (Signed)
Pt asking to use the phone, but stated he was going to call [phone] to come get him.  Pt will not be allowed to use the phone at this time.

## 2020-11-25 NOTE — Consult Note (Signed)
[facility]   Reason for Consult:   Follow-up consult for this [age] year old man with chronic behavior problems anxiety and reliance on high-dose medication Referring Physician: Paduchowski Patient Identification: Cory Moses MRN:  [id number] Principal Diagnosis: Anxiety Diagnosis:  Principal Problem:   Anxiety Active Problems:   Hypertension   Total Time spent with patient: 30 minutes  Subjective:   Cory Moses is a 60 y.o. male patient admitted with "when can I get out of here?".  HPI: Follow-up: Evaluation of this patient yesterday.  He has been continued on his methadone and clonazepam.  It was not seen as necessary to restart Xanax in addition to it.  Patient slept okay and his behavior has been adequate.  He is intrusive and talkative but reasonably organized in his speech not talking about anything bizarre.  Does not appear to be responding to internal stimuli.  No sign of psychosis.  Very focused on his medication and wanting to be discharged.  Patient has not been aggressive or threatening.  He denies any thoughts of harming his wife or anyone else and denies any thoughts of harming himself.  Past Psychiatric History: Patient has a longstanding history of anxiety and chronic pain  Risk to Self:   Risk to Others:   Prior Inpatient Therapy:   Prior Outpatient Therapy:    Past Medical History:  Past Medical History:  Diagnosis Date  . COPD (chronic obstructive pulmonary disease) (HCC)   . Seizures (HCC)   . Vascular dementia [facility](HCC)    History reviewed. No pertinent surgical history. Family History: No family history on file. Family Psychiatric  History: See previous Social History:  Social History   Substance and Sexual Activity  Alcohol Use No     Social History   Substance and Sexual Activity  Drug Use No    Social History   Socioeconomic History  . Marital status: Married    Spouse name: Not on file  . Number of  children: Not on file  . Years of education: Not on file  . Highest education level: Not on file  Occupational History  . Not on file  Tobacco Use  . Smoking status: Current Every Day Smoker  . Smokeless tobacco: Never Used  Substance and Sexual Activity  . Alcohol use: No  . Drug use: No  . Sexual activity: Never  Other Topics Concern  . Not on file  Social History Narrative  . Not on file   Social Determinants of Health   Financial Resource Strain: Not on file  Food Insecurity: Not on file  Transportation Needs: Not on file  Physical Activity: Not on file  Stress: Not on file  Social Connections: Not on file   Additional Social History:    Allergies:   Allergies  Allergen Reactions  . Morphine And Related Anxiety    Labs:  Results for orders placed or performed during the hospital encounter of 11/24/20 (from the past 48 hour(s))  Comprehensive metabolic panel     Status: Abnormal   Collection Time: 11/24/20  1:26 PM  Result Value Ref Range   Sodium 135 135 - 145 mmol/L   Potassium 4.0 3.5 - 5.1 mmol/L   Chloride 99 98 - 111 mmol/L   CO2 25 22 - 32 mmol/L   Glucose, Bld 129 (H) 70 - 99 mg/dL    Comment: Glucose reference range applies only to samples taken after fasting for at least 8 hours.   BUN 13 8 -  23 mg/dL   Creatinine, Ser 6.04 0.61 - 1.24 mg/dL   Calcium 9.0 8.9 - 54.0 mg/dL   Total Protein 8.1 6.5 - 8.1 g/dL   Albumin 4.2 3.5 - 5.0 g/dL   AST 48 (H) 15 - 41 U/L   ALT 20 0 - 44 U/L   Alkaline Phosphatase 115 38 - 126 U/L   Total Bilirubin 0.5 0.3 - 1.2 mg/dL   GFR, Estimated >98 >11 mL/min    Comment: (NOTE) Calculated using the CKD-EPI Creatinine Equation (2021)    Anion gap 11 5 - 15    Comment: Performed at [facility], [address] [place], [place] [address]  Ethanol     Status: None   Collection Time: 11/24/20  1:26 PM  Result Value Ref Range   Alcohol, Ethyl (B) <10 <10 mg/dL    Comment: (NOTE) Lowest detectable limit for  serum alcohol is 10 mg/dL.  For medical purposes only. Performed at [facility], [address] [place], [place] [address]   Salicylate level     Status: Abnormal   Collection Time: 11/24/20  1:26 PM  Result Value Ref Range   Salicylate Lvl <7.0 (L) 7.0 - 30.0 mg/dL    Comment: Performed at [facility], [address] [place], [place] [address]  Acetaminophen level     Status: Abnormal   Collection Time: 11/24/20  1:26 PM  Result Value Ref Range   Acetaminophen (Tylenol), Serum <10 (L) 10 - 30 ug/mL    Comment: (NOTE) Therapeutic concentrations vary significantly. A range of 10-30 ug/mL  may be an effective concentration for many patients. However, some  are best treated at concentrations outside of this range. Acetaminophen concentrations >150 ug/mL at 4 hours after ingestion  and >50 ug/mL at 12 hours after ingestion are often associated with  toxic reactions.  Performed at [facility], [address] [place], [place] [address]   cbc     Status: Abnormal   Collection Time: 11/24/20  1:26 PM  Result Value Ref Range   WBC 6.8 4.0 - 10.5 K/uL   RBC 4.26 4.22 - 5.81 MIL/uL   Hemoglobin 12.1 (L) 13.0 - 17.0 g/dL   HCT 96.2 (L) 95.2 - 84.1 %   MCV 86.2 80.0 - 100.0 fL   MCH 28.4 26.0 - 34.0 pg   MCHC 33.0 30.0 - 36.0 g/dL   RDW 32.4 40.1 - 02.7 %   Platelets 339 150 - 400 K/uL   nRBC 0.0 0.0 - 0.2 %    Comment: Performed at [facility], [address] [place], [place] [address]  SARS CORONAVIRUS 2 (TAT 6-24 HRS) Nasopharyngeal Nasopharyngeal Swab     Status: None   Collection Time: 11/24/20  3:01 PM   Specimen: Nasopharyngeal Swab  Result Value Ref Range   SARS Coronavirus 2 NEGATIVE NEGATIVE    Comment: (NOTE) SARS-CoV-2 target nucleic acids are NOT DETECTED.  The SARS-CoV-2 RNA is generally detectable in upper and lower respiratory specimens during the acute phase of infection. Negative results do not preclude SARS-CoV-2  infection, do not rule out co-infections with other pathogens, and should not be used as the sole basis for treatment or other patient management decisions. Negative results must be combined with clinical observations, patient history, and epidemiological information. The expected result is Negative.  Fact Sheet for Patients: HairSlick.no  Fact Sheet for Healthcare Providers: quierodirigir.com  This test is not yet approved or cleared by the [place] FDA  and  has been authorized for detection and/or diagnosis of SARS-CoV-2 by FDA under an Emergency Use Authorization (EUA). This EUA will remain  in effect (meaning this test can be used) for the duration of the COVID-19 declaration under Se ction 564(b)(1) of the Act, 21 U.S.C. section 360bbb-3(b)(1), unless the authorization is terminated or revoked sooner.  Performed at [facility], [address] [place], [place] [address]   Urine Drug Screen, Qualitative     Status: Abnormal   Collection Time: 11/24/20  7:33 PM  Result Value Ref Range   Tricyclic, Ur Screen NONE DETECTED NONE DETECTED   Amphetamines, Ur Screen NONE DETECTED NONE DETECTED   MDMA (Ecstasy)Ur Screen NONE DETECTED NONE DETECTED   Cocaine Metabolite,Ur [place] NONE DETECTED NONE DETECTED   Opiate, Ur Screen NONE DETECTED NONE DETECTED   Phencyclidine (PCP) Ur S NONE DETECTED NONE DETECTED   Cannabinoid 50 Ng, Ur [place] NONE DETECTED NONE DETECTED   Barbiturates, Ur Screen NONE DETECTED NONE DETECTED   Benzodiazepine, Ur Scrn POSITIVE (A) NONE DETECTED   Methadone Scn, Ur POSITIVE (A) NONE DETECTED    Comment: (NOTE) Tricyclics + metabolites, urine    Cutoff 1000 ng/mL Amphetamines + metabolites, urine  Cutoff 1000 ng/mL MDMA (Ecstasy), urine              Cutoff 500 ng/mL Cocaine Metabolite, urine          Cutoff 300 ng/mL Opiate + metabolites, urine        Cutoff 300 ng/mL Phencyclidine (PCP), urine          Cutoff 25 ng/mL Cannabinoid, urine                 Cutoff 50 ng/mL Barbiturates + metabolites, urine  Cutoff 200 ng/mL Benzodiazepine, urine              Cutoff 200 ng/mL Methadone, urine                   Cutoff 300 ng/mL  The urine drug screen provides only a preliminary, unconfirmed analytical test result and should not be used for non-medical purposes. Clinical consideration and professional judgment should be applied to any positive drug screen result due to possible interfering substances. A more specific alternate chemical method must be used in order to obtain a confirmed analytical result. Gas chromatography / mass spectrometry (GC/MS) is the preferred confirm atory method. Performed at [facility], [address] [place], [place] [address]     Current Facility-Administered Medications  Medication Dose Route Frequency Provider Last Rate Last Admin  . apixaban (ELIQUIS) tablet 5 mg  5 mg Oral BID Minna Antis, MD   5 mg at 11/25/20 1220  . clonazePAM (KLONOPIN) tablet 2 mg  2 mg Oral TID Gordie Crumby, Jackquline Denmark, MD   2 mg at 11/25/20 0809  . gabapentin (NEURONTIN) capsule 300 mg  300 mg Oral QHS Paduchowski, Caryn Bee, MD      . lisinopril (ZESTRIL) tablet 20 mg  20 mg Oral Daily Mackenzee Becvar, Jackquline Denmark, MD   20 mg at 11/25/20 0814  . methadone (DOLOPHINE) tablet 10 mg  10 mg Oral Q8H Hatsumi Steinhart T, MD   10 mg at 11/25/20 0530  . pantoprazole (PROTONIX) EC tablet 40 mg  40 mg Oral Daily Ebon Ketchum, Jackquline Denmark, MD   40 mg at 11/25/20 3559   Current Outpatient Medications  Medication Sig Dispense Refill  . albuterol (PROVENTIL HFA;VENTOLIN HFA) 108 (90 Base) MCG/ACT inhaler Inhale  2 puffs into the lungs every 4 (four) hours as needed for wheezing or shortness of breath.    . ALPRAZolam (XANAX) 1 MG tablet Take 1 mg by mouth 3 (three) times daily as needed for anxiety.    [facility] Kitchen apixaban (ELIQUIS) 5 MG TABS tablet Take 5 mg by mouth 2 (two) times daily.    . clonazePAM (KLONOPIN) 2 MG  tablet Take 2 mg by mouth 3 (three) times daily as needed for anxiety.    Marland Kitchen esomeprazole (NEXIUM) 40 MG capsule Take 40 mg by mouth daily.    Marland Kitchen gabapentin (NEURONTIN) 300 MG capsule Take 300 mg by mouth at bedtime.    . promethazine (PHENERGAN) 25 MG tablet Take 25 mg by mouth 2 (two) times daily as needed for nausea or vomiting.      Musculoskeletal: Strength & Muscle Tone: within normal limits Gait & Station: normal Patient leans: N/A            Psychiatric Specialty Exam:  Presentation  General Appearance: No data recorded Eye Contact:No data recorded Speech:No data recorded Speech Volume:No data recorded Handedness:No data recorded  Mood and Affect  Mood:No data recorded Affect:No data recorded  Thought Process  Thought Processes:No data recorded Descriptions of Associations:No data recorded Orientation:No data recorded Thought Content:No data recorded History of Schizophrenia/Schizoaffective disorder:No data recorded Duration of Psychotic Symptoms:No data recorded Hallucinations:No data recorded Ideas of Reference:No data recorded Suicidal Thoughts:No data recorded Homicidal Thoughts:No data recorded  Sensorium  Memory:No data recorded Judgment:No data recorded Insight:No data recorded  Executive Functions  Concentration:No data recorded Attention Span:No data recorded Recall:No data recorded Fund of Knowledge:No data recorded Language:No data recorded  Psychomotor Activity  Psychomotor Activity:No data recorded  Assets  Assets:No data recorded  Sleep  Sleep:No data recorded  Physical Exam: Physical Exam Vitals and nursing note reviewed.  Constitutional:      Appearance: Normal appearance.  HENT:     Head: Normocephalic and atraumatic.     Mouth/Throat:     Pharynx: Oropharynx is clear.  Eyes:     Pupils: Pupils are equal, round, and reactive to light.  Cardiovascular:     Rate and Rhythm: Normal rate and regular rhythm.  Pulmonary:      Effort: Pulmonary effort is normal.     Breath sounds: Normal breath sounds.  Abdominal:     General: Abdomen is flat.     Palpations: Abdomen is soft.  Musculoskeletal:        General: Normal range of motion.  Skin:    General: Skin is warm and dry.  Neurological:     General: No focal deficit present.     Mental Status: He is alert. Mental status is at baseline.  Psychiatric:        Attention and Perception: He is inattentive.        Mood and Affect: Mood is anxious.        Speech: Speech is rapid and pressured.        Behavior: Behavior is agitated. Behavior is not aggressive.        Thought Content: Thought content normal.        Cognition and Memory: Cognition is impaired.        Judgment: Judgment is impulsive.    Review of Systems  Constitutional: Negative.   HENT: Negative.   Eyes: Negative.   Respiratory: Negative.   Cardiovascular: Negative.   Gastrointestinal: Negative.   Musculoskeletal: Negative.   Skin: Negative.   Neurological: Negative.  Psychiatric/Behavioral: Negative.    Blood pressure (!) 184/95, pulse 87, temperature 98.2 F (36.8 C), temperature source Oral, resp. rate 20, SpO2 96 %. There is no height or weight on file to calculate BMI.  Treatment Plan Summary: Plan On reevaluation today he appears to be stable and not showing signs of psychosis.  Primarily seems to have anxiety and overreliance on medication.  There is no sign at this point of acute dangerousness.  He has a safe place to live and outpatient treatment in place.  No longer meets commitment criteria.  Discontinue IVC and recommend patient be discharged home with continued follow up with his usual providers.  Case reviewed with ER doctor.  Disposition: Patient does not meet criteria for psychiatric inpatient admission. Supportive therapy provided about ongoing stressors.  Mordecai Rasmussen, MD 11/25/2020 12:32 PM

## 2020-11-25 NOTE — BH Assessment (Addendum)
Referral information for Psychiatric Hospitalization faxed to;   [facility] [phone]), Denied by doctor due to chronic issues such as anxiety and aggression  . [facility] [phone]- [phone]) [phone] No current Geri beds available at this time  . [facility] [phone]), No answer at any of the 3 numbers  . [facility] [phone], [phone], [phone] or [phone]), No behavioral health intake staff until after 8am  . [facility] [phone] or [phone]), No nurse available until after 8am

## 2020-11-25 NOTE — ED Notes (Signed)
RN attempted to have patient retrieve his daughter's phone number for a ride home.  Pt was unable to retrieve any numbers from his phone.  RN called the pt's wife to get the daughter's phone number. The pt's wife is upset about pt's discharge and did not give the daughter's phone number.  Pt's wife also stated she was on the phone with [facility].  Dr. Toni Amend made aware.  TOC consult will be placed.

## 2020-11-25 NOTE — ED Notes (Signed)
Rn called Ms. Prabhakar (pt's wife) to make her aware of pt's discharge. Ms. Stfleur wants the psychiatrist to call her. RN informed her this was would be difficult due to doctor's busy schedule.  Ms. Cambre then wanted to know who would follow up with the patient after he left the ED.   TTS made aware.

## 2020-11-25 NOTE — BH Assessment (Signed)
Per the request of the patient's nurse (Amy B.), writer called and spoke with the patient's wife [phone]). Discussed the patient follow up with his current outpatient providers (PCP & Psychiatrist). She states she wasn't happy with their services. Writer informed her he would provide her with referral information for [facility]. Also informed her they have a wait list and time for new patients.  Writer updated the patient's nurse (Amy T.) and provided her with the referral information to add to his discharge papers.

## 2020-11-25 NOTE — ED Notes (Signed)
Pt is agitate. Pt is yelling requesting for "Xanax". This Neurosurgeon walked pt to the room. Amy, RN aware

## 2020-11-25 NOTE — ED Notes (Signed)
Pt discharged home with wife. All belongings (wallet, cell phone) returned to patient.  Discharge instructions and resources given to patient.  Pt refused to take off burgundy scrubs.

## 2020-11-25 NOTE — ED Notes (Signed)
Patient presents to nurses station with a request for coke with ice. He them proceeded to state that he "prayed and felt better" regarding him wanting O2. Will continue to monitor.

## 2020-11-25 NOTE — ED Notes (Signed)
Patient has not slept the entire shift. He has been back and forth to the nurses station. Tangential and somatic, saying things such as if he does not get his Xanax he will have a heart attack. He also said while talking, he was having a seizure. Patient did not have either. Stable.

## 2020-11-25 NOTE — Discharge Instructions (Addendum)
You have been seen in the emergency department for a  psychiatric concern. You have been evaluated both medically as well as psychiatrically. Please follow-up with your outpatient resources provided. Return to the emergency department for any worsening symptoms, or any thoughts of hurting yourself or anyone else so that we may attempt to help you. 

## 2020-12-22 DIAGNOSIS — F0390 Unspecified dementia without behavioral disturbance: Secondary | ICD-10-CM | POA: Insufficient documentation

## 2020-12-28 DIAGNOSIS — F0150 Vascular dementia without behavioral disturbance: Secondary | ICD-10-CM | POA: Insufficient documentation

## 2021-01-06 DIAGNOSIS — Z79899 Other long term (current) drug therapy: Secondary | ICD-10-CM | POA: Insufficient documentation

## 2021-10-25 DIAGNOSIS — Z593 Problems related to living in residential institution: Secondary | ICD-10-CM | POA: Insufficient documentation

## 2021-10-25 DIAGNOSIS — N138 Other obstructive and reflux uropathy: Secondary | ICD-10-CM | POA: Insufficient documentation

## 2021-10-25 DIAGNOSIS — N139 Obstructive and reflux uropathy, unspecified: Secondary | ICD-10-CM | POA: Insufficient documentation

## 2021-10-25 DIAGNOSIS — R339 Retention of urine, unspecified: Secondary | ICD-10-CM | POA: Insufficient documentation

## 2021-12-01 IMAGING — CT CT HEAD W/O CM
2 series · 15 of 37 positions shown, 18 images · non-contrast
Comparison: CT December 09, 2017 and MRI December 10, 2017.

CLINICAL DATA: Mental status change. Behavioral change. Evaluate
for intracranial hemorrhage.

EXAM:
CT HEAD WITHOUT CONTRAST
TECHNIQUE: Contiguous axial images were obtained from the base of the skull
through the vertex without intravenous contrast.

[Series 3: head wo · axial · 0.43mm/px · z∈[+309,+444]mm · 12 of 33 slices shown, 15 images]
[im 3/33  brain]
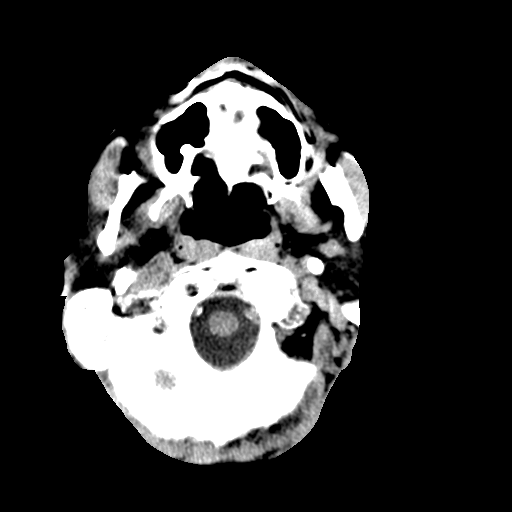
[im 3/33  bone]
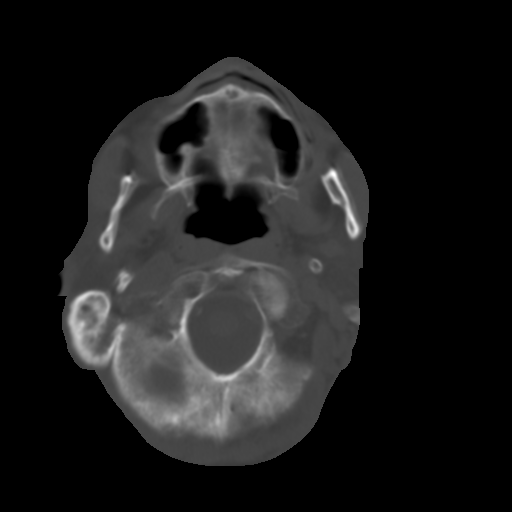
[im 5/33  brain]
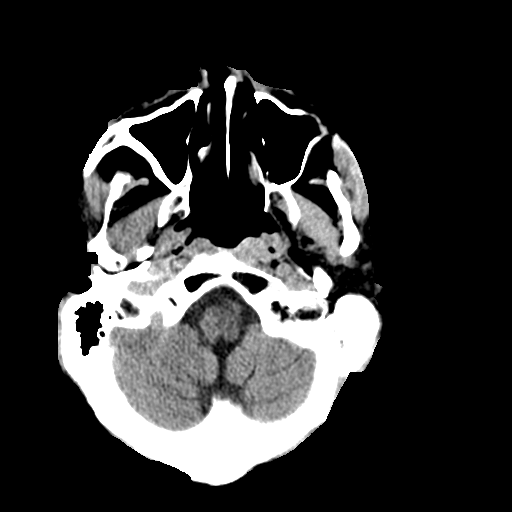
[im 7/33  brain]
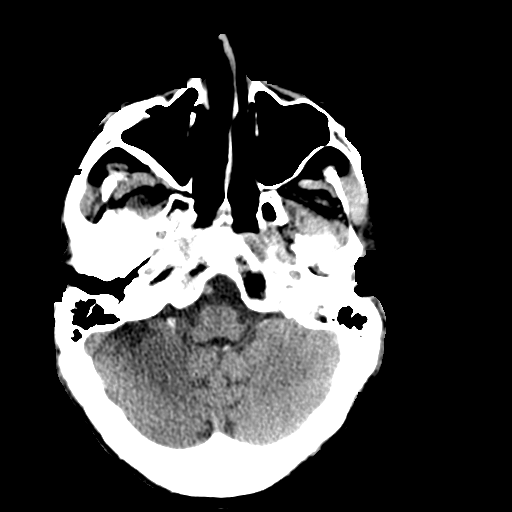
[im 10/33  brain]
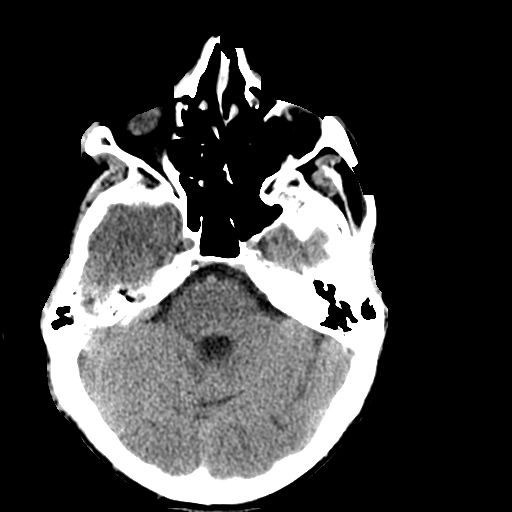
[im 13/33  brain]
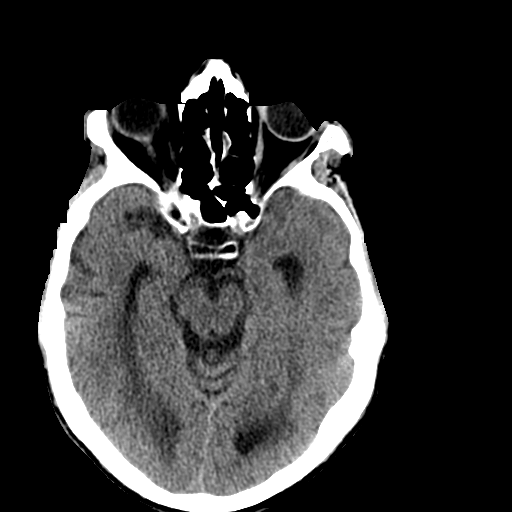
[im 13/33  bone]
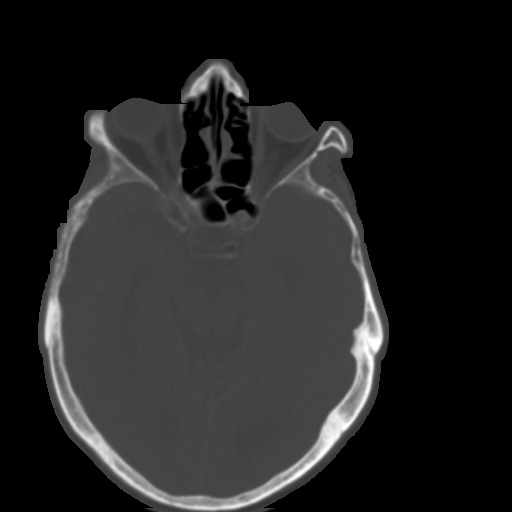
[im 15/33  brain]
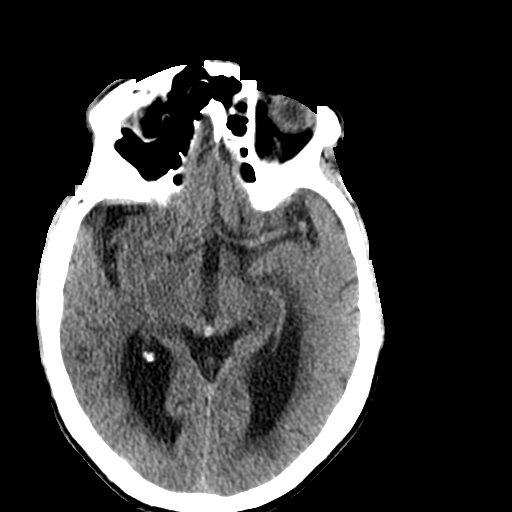
[im 18/33  brain]
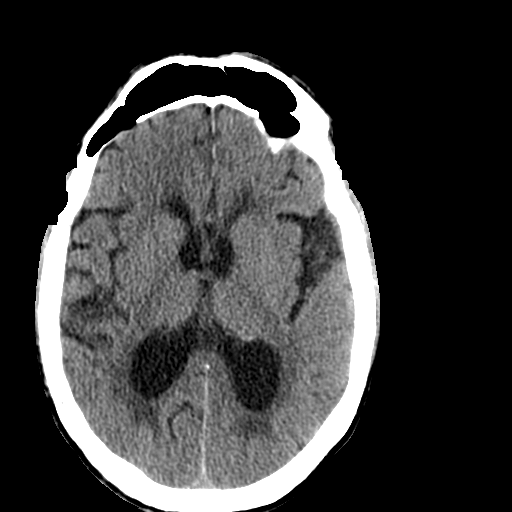
[im 20/33  brain]
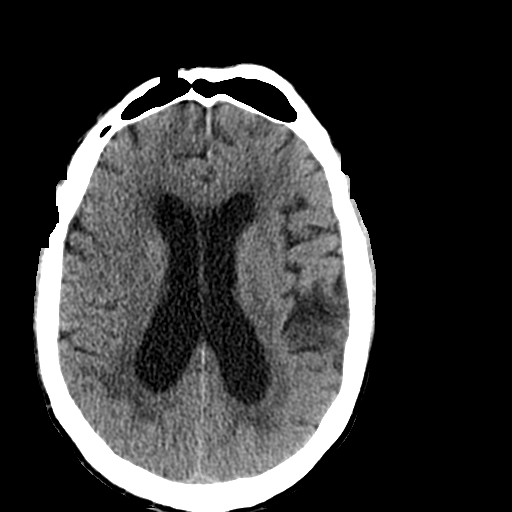
[im 23/33  brain]
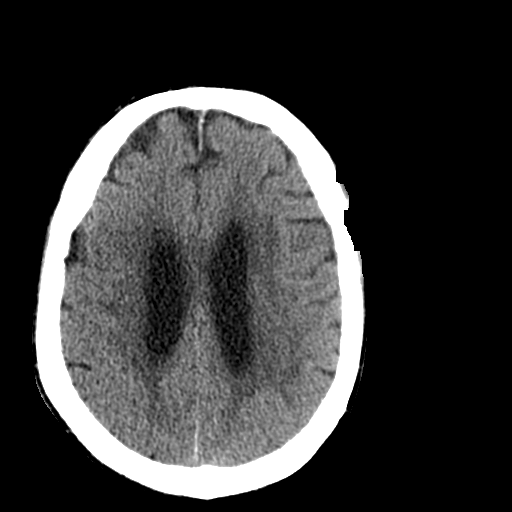
[im 23/33  bone]
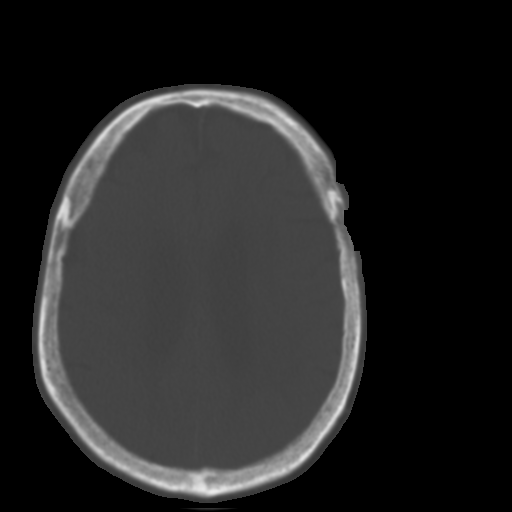
[im 26/33  brain]
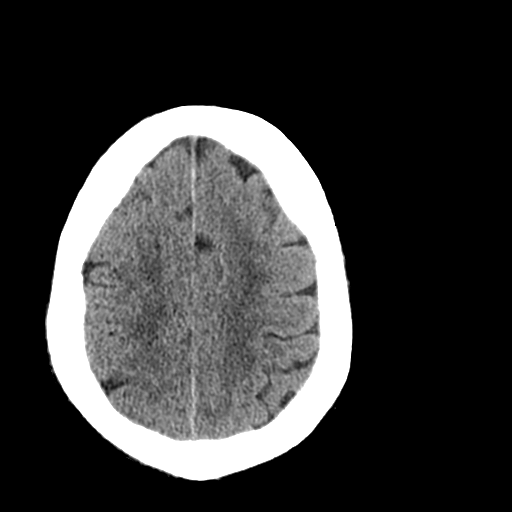
[im 28/33  brain]
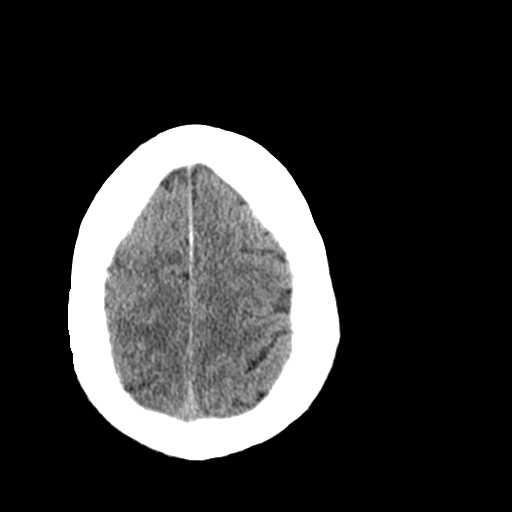
[im 30/33  brain]
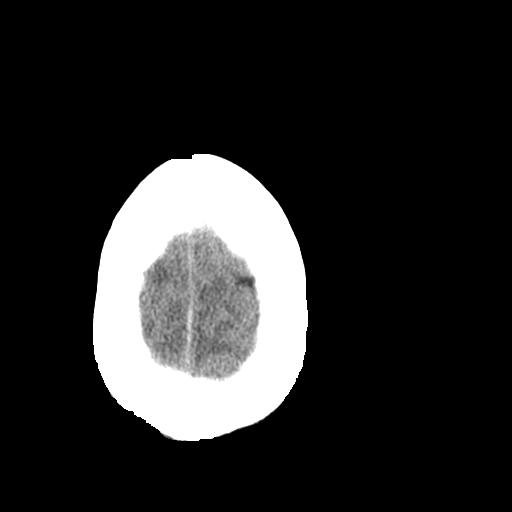

[Series 5: sagittal soft tissue · sagittal · 0.34mm/px · 3 of 59 slices shown]
[im 20/59  brain]
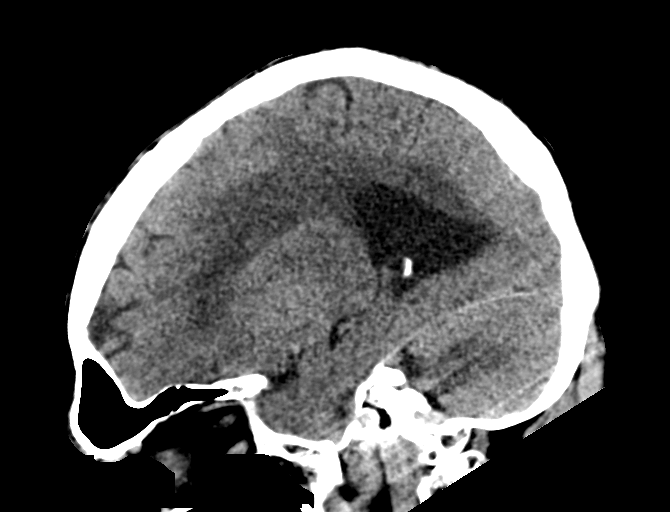
[im 30/59  brain]
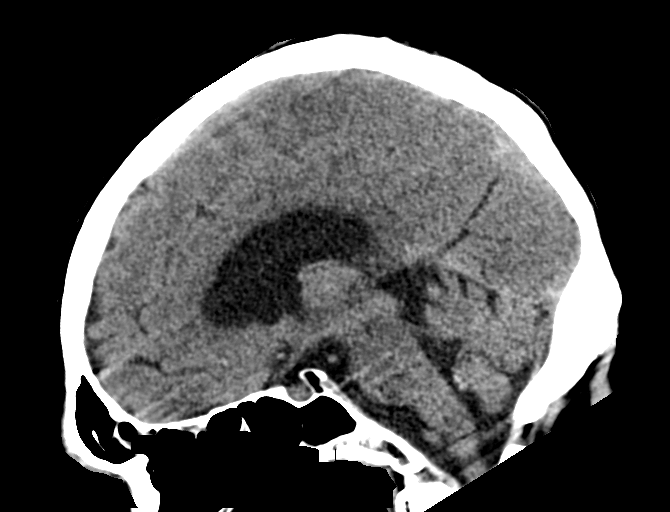
[im 39/59  brain]
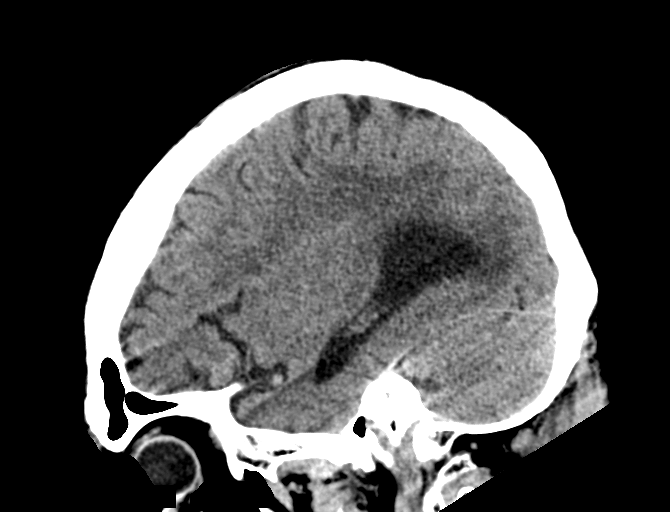

[15 of 37 positions shown; findings below may reference images not displayed]

FINDINGS: Brain: No evidence of acute large vascular territory infarction,
hemorrhage, extra-axial collection or mass lesion/mass effect.
Similar ventriculomegaly, which is slightly out of proportion to the
degree of sulcal volume loss. Mild crowding of sulci at the vertex.
Moderate patchy white matter hypoattenuation, most likely related to
chronic microvascular ischemic disease.

Vascular: Calcific atherosclerosis. No hyperdense vessel identified.

Skull: No acute fracture.

Sinuses/Orbits: Moderate mucosal thickening of scattered ethmoid air
cells.

Other: No mastoid effusions.
IMPRESSION: 1. Similar ventriculomegaly, which is out of proportion to the
degree of sulcal volume loss. This finding is nonspecific but can be
seen with normal pressure hydrocephalus.
2. Otherwise, no acute intracranial abnormality.
3. Moderate chronic microvascular ischemic disease.
4. Moderate ethmoid air cell mucosal thickening.

## 2021-12-27 ENCOUNTER — Encounter: Payer: Self-pay | Admitting: Urology

## 2021-12-27 ENCOUNTER — Ambulatory Visit: Payer: Medicare Other | Admitting: Urology

## 2021-12-27 ENCOUNTER — Other Ambulatory Visit: Payer: Self-pay | Admitting: *Deleted

## 2021-12-27 ENCOUNTER — Other Ambulatory Visit
Admission: RE | Admit: 2021-12-27 | Discharge: 2021-12-27 | Disposition: A | Discharge status: Home / Self Care | Payer: Medicare Other | Attending: Urology | Admitting: Urology

## 2021-12-27 VITALS — BP 128/82 | HR 85 | Ht 72.0 in | Wt 137.0 lb

## 2021-12-27 DIAGNOSIS — R32 Unspecified urinary incontinence: Secondary | ICD-10-CM | POA: Diagnosis not present

## 2021-12-27 DIAGNOSIS — R3912 Poor urinary stream: Secondary | ICD-10-CM | POA: Diagnosis not present

## 2021-12-27 DIAGNOSIS — N400 Enlarged prostate without lower urinary tract symptoms: Secondary | ICD-10-CM | POA: Diagnosis present

## 2021-12-27 LAB — URINALYSIS, COMPLETE (UACMP) WITH MICROSCOPIC
Glucose, UA: NEGATIVE mg/dL
Hgb urine dipstick: NEGATIVE
Leukocytes,Ua: NEGATIVE
Nitrite: NEGATIVE
Specific Gravity, Urine: 1.02 (ref 1.005–1.030)
pH: 5.5 (ref 5.0–8.0)

## 2021-12-27 LAB — BLADDER SCAN AMB NON-IMAGING

## 2021-12-27 MED ORDER — ALFUZOSIN HCL ER 10 MG PO TB24: 10.0000 mg | ORAL_TABLET | Freq: Every day | ORAL | 3 refills | Status: DC

## 2021-12-27 NOTE — Progress Notes (Signed)
   12/27/21 2:39 PM   Cory Moses Nira Conn 14-Sep-1960 [id number]  CC: Lower urinary tract symptoms  HPI: Comorbid 61 year old man who presents with his wife today for lower urinary tract symptoms.  She provides most of the history.  He was seen at [facility] ER in March 2023 for lower abdominal pain, and CT showed significant constipation with a distended bladder but no hydronephrosis.  Prostate measured 40 g.  His urinary symptoms of weak stream and incontinence improved significantly after normalization of his bowel regimen.  He followed up with [facility] urology in April and bladder scan was normal, and he was started on dutasteride and silodosin.  He previously did not see significant improvement on Flomax previously.  His main complaint today is weak urinary stream and nocturia x1.  He drinks coffee, tea, soda, and water during the day.  He denies any gross hematuria or dysuria.  He previously was having some problems with incontinence, but that has since resolved since bowel regimen has normalized.  Urinalysis today is benign, and PVR is normal at 0 mL.   PMH: Past Medical History:  Diagnosis Date   COPD (chronic obstructive pulmonary disease) (HCC)    Seizures (HCC)    Vascular dementia (HCC)      Family History: No family history on file.  Social History:  reports that he has been smoking. He has been exposed to tobacco smoke. He has never used smokeless tobacco. He reports that he does not drink alcohol and does not use drugs.  Physical Exam: BP 128/82   Pulse 85   Ht 6' (1.829 m)   Wt 137 lb (62.1 kg)   BMI 18.58 kg/m    Constitutional:  Alert and oriented, No acute distress. Cardiovascular: No clubbing, cyanosis, or edema. Respiratory: Normal respiratory effort, no increased work of breathing. GI: Abdomen is soft, nontender, nondistended, no abdominal masses  Laboratory Data: Reviewed, see HPI  Pertinent Imaging: I have personally viewed and interpreted the CT abdomen and  pelvis from [facility] from March 2023, prostate measures 45 g, no hydronephrosis, benign-appearing renal cysts, no stones, distended bladder.  Assessment & Plan:   Comorbid 61 year old male with mild urinary symptoms of weak stream and nocturia x1.  Realistic expectations were discussed.  He previously had a CT scan from March 2023 with a distended bladder, but that was at the time of severe constipation and urinary symptoms have improved since that time.  Urinalysis and PVR normal today.  He is not a good candidate for PSA screening with his comorbidities.  He was started on dutasteride and silodosin by urology at [facility] in April 2023, and he is not sure this has made a significant improvement.  I recommended trying alfuzosin to replace the silodosin to see if he gets more improvement in his weak stream, and we reviewed that the dutasteride can take up to 6 months to see a difference.  We reviewed behavioral strategies including minimizing bladder irritants, avoiding constipation, timed voiding, and return precautions were discussed.  Trial of alfuzosin to replace silodosin Continue dutasteride RTC 6 months symptom check   Legrand Rams, MD 12/27/2021  [facility] [address] [place], [place] [address] [phone]

## 2022-04-04 ENCOUNTER — Other Ambulatory Visit: Payer: Self-pay | Admitting: Family Medicine

## 2022-04-04 DIAGNOSIS — K5900 Constipation, unspecified: Secondary | ICD-10-CM

## 2022-04-05 ENCOUNTER — Ambulatory Visit
Admission: RE | Admit: 2022-04-05 | Discharge: 2022-04-05 | Disposition: A | Discharge status: Home / Self Care | Payer: Medicare (Managed Care) | Source: Ambulatory Visit | Attending: Family Medicine | Admitting: Family Medicine

## 2022-04-05 ENCOUNTER — Ambulatory Visit
Admission: RE | Admit: 2022-04-05 | Discharge: 2022-04-05 | Disposition: A | Discharge status: Home / Self Care | Payer: Medicare (Managed Care) | Attending: Family Medicine | Admitting: Family Medicine

## 2022-04-05 DIAGNOSIS — K5900 Constipation, unspecified: Secondary | ICD-10-CM | POA: Diagnosis not present

## 2022-06-13 ENCOUNTER — Encounter: Payer: Self-pay | Admitting: Urology

## 2022-06-13 ENCOUNTER — Ambulatory Visit (INDEPENDENT_AMBULATORY_CARE_PROVIDER_SITE_OTHER): Payer: Medicare (Managed Care) | Admitting: Urology

## 2022-06-13 VITALS — BP 137/72 | HR 75 | Ht 72.0 in

## 2022-06-13 DIAGNOSIS — N401 Enlarged prostate with lower urinary tract symptoms: Secondary | ICD-10-CM

## 2022-06-13 DIAGNOSIS — N400 Enlarged prostate without lower urinary tract symptoms: Secondary | ICD-10-CM

## 2022-06-13 MED ORDER — ALFUZOSIN HCL ER 10 MG PO TB24: 10.0000 mg | ORAL_TABLET | Freq: Every day | ORAL | 3 refills | Status: DC

## 2022-06-13 MED ORDER — DUTASTERIDE 0.5 MG PO CAPS: 0.5000 mg | ORAL_CAPSULE | Freq: Every day | ORAL | 3 refills | Status: DC

## 2022-06-13 NOTE — Progress Notes (Signed)
   06/13/2022 1:17 PM   [place] 24-Jun-1961 [id number]  Reason for visit: Follow up BPH/LUTS  HPI: Comorbid 61 year old male who is here with his wife today for follow-up of lower urinary tract symptoms.  She again provides most of the history.  He originally had some problems with urination when he had severe constipation, but those improved after working on his constipation and starting maximal medical therapy for BPH.  CT in March 2023 showed no significant urologic abnormalities, and prostate measured 45g.  At our visit in June 2023 we transitioned him to alfuzosin and continued dutasteride.  He overall is doing well.  PVR is normal today at 47ml.  He reports some postvoid dribbling, but otherwise no significant urinary complaints.  Not having any incontinence, nocturia 0-1 time overnight.  Drinks 1 soda during the day but otherwise water.  Denies any dysuria or gross hematuria.  Urinalysis was benign at our last visit.  We previously had discussed PSA screening, and that with his numerous comorbidities would not be a good candidate for screening.  Alfuzosin and dutasteride refilled RTC 9 months PVR Could consider cystoscopy in the future if worsening urinary symptoms  Sondra Come, MD  [facility] [address], [address] [place], [place] [address] [phone]

## 2022-06-13 NOTE — Addendum Note (Signed)
Addended by: Frankey Shown on: 06/13/2022 01:23 PM   Modules accepted: Orders

## 2022-10-17 ENCOUNTER — Encounter (INDEPENDENT_AMBULATORY_CARE_PROVIDER_SITE_OTHER): Payer: Self-pay | Admitting: Vascular Surgery

## 2022-10-17 ENCOUNTER — Ambulatory Visit (INDEPENDENT_AMBULATORY_CARE_PROVIDER_SITE_OTHER): Payer: Medicare (Managed Care) | Admitting: Vascular Surgery

## 2022-10-17 VITALS — BP 126/75 | HR 94 | Resp 18 | Ht 73.0 in | Wt 147.0 lb

## 2022-10-17 DIAGNOSIS — D6851 Activated protein C resistance: Secondary | ICD-10-CM | POA: Diagnosis not present

## 2022-10-17 DIAGNOSIS — M79672 Pain in left foot: Secondary | ICD-10-CM | POA: Diagnosis not present

## 2022-10-17 DIAGNOSIS — E46 Unspecified protein-calorie malnutrition: Secondary | ICD-10-CM | POA: Diagnosis not present

## 2022-10-17 DIAGNOSIS — M79673 Pain in unspecified foot: Secondary | ICD-10-CM | POA: Insufficient documentation

## 2022-10-17 NOTE — Progress Notes (Signed)
Patient ID: Cory Moses, male   DOB: 1961-04-17, 62 y.o.   MRN: [id number]  Chief Complaint  Patient presents with   New Patient (Initial Visit)    NP. consult. van horn. LLE pain. outside art/ven studies in referral packet    HPI Cory Moses is a 62 y.o. male.  I am asked to see the patient by Dr. Clovis Pu for evaluation of left foot and heel pain.  The patient is not a great historian and his family provides much of the history.  About a month ago, the patient began having abrupt left heel pain.  This was abrupt in onset and felt like a burning and stinging pain.  He has a small pressure spot on that left heel they are trying to pad and protect.  No evidence of erythema or drainage.  No fevers or chills or signs of systemic infection.  He has a history of factor V Leiden and has had multiple previous DVTs.  He was evaluated with noninvasive studies by his healthcare agency.  Venous duplex showed no evidence of DVT or superficial thrombophlebitis.  Arterial duplex demonstrated only some tibial level disease that did not appear to be particularly severe by the studies.   Past Medical History:  Diagnosis Date   COPD (chronic obstructive pulmonary disease)    Seizures    Vascular dementia     No past surgical history on file.   Family History No history of aneurysms, bleeding disorders, or autoimmune diseases.  Patient is a poor historian and his family provides much of the history   Social History   Tobacco Use   Smoking status: Every Day    Passive exposure: Current   Smokeless tobacco: Never  Substance Use Topics   Alcohol use: No   Drug use: No     Allergies  Allergen Reactions   Morphine Other (See Comments)    Other reaction(s): Other (See Comments), Other (See Comments)  Doesn't like Morphine, mental status changes.  Doesn't like Morphine  Doesn't like Morphine, mental status changes.    Doesn't like Morphine    Other reaction(s): Other  (See Comments), Other (See Comments) Doesn't like Morphine, mental status changes. Doesn't like Morphine   Morphine And Related Anxiety    Current Outpatient Medications  Medication Sig Dispense Refill   albuterol (PROVENTIL HFA;VENTOLIN HFA) 108 (90 Base) MCG/ACT inhaler Inhale 2 puffs into the lungs every 4 (four) hours as needed for wheezing or shortness of breath.     amLODipine (NORVASC) 10 MG tablet Take 10 mg by mouth daily.     apixaban (ELIQUIS) 5 MG TABS tablet Take 5 mg by mouth 2 (two) times daily.     Ascorbic Acid (VITAMIN C) 250 MG CHEW Chew by mouth.     Calcium Carb-Cholecalciferol 600-10 MG-MCG TABS Take 1 tablet by mouth 2 (two) times daily.     clonazePAM (KLONOPIN) 0.5 MG tablet Take 1 mg by mouth 2 (two) times daily.     dutasteride (AVODART) 0.5 MG capsule Take 1 capsule (0.5 mg total) by mouth daily. 90 capsule 3   lisinopril (ZESTRIL) 2.5 MG tablet Take 2.5 mg by mouth daily.     melatonin 3 MG TABS tablet Take by mouth.     methadone (DOLOPHINE) 10 MG tablet Take 10 mg by mouth 3 (three) times daily as needed.     mirtazapine (REMERON) 30 MG tablet Take 30 mg by mouth at bedtime.     OXYGEN  2 L at bedtime.     risperiDONE (RISPERDAL) 1 MG tablet Take by mouth.     risperiDONE (RISPERDAL) 2 MG tablet Take 2 mg by mouth at bedtime.     senna-docusate (SENOKOT-S) 8.6-50 MG tablet Take by mouth.     thiamine 100 MG tablet Take by mouth.     TRELEGY ELLIPTA 100-62.5-25 MCG/ACT AEPB Inhale 1 puff into the lungs daily.     alfuzosin (UROXATRAL) 10 MG 24 hr tablet Take 1 tablet (10 mg total) by mouth daily with breakfast. (Patient not taking: Reported on 10/17/2022) 90 tablet 3   BREO ELLIPTA 100-25 MCG/ACT AEPB Inhale 1 puff into the lungs daily. (Patient not taking: Reported on 10/17/2022)     esomeprazole (NEXIUM) 40 MG capsule Take 40 mg by mouth daily. (Patient not taking: Reported on 10/17/2022)     lactulose (CHRONULAC) 10 GM/15ML solution SMARTSIG:Milliliter(s) By  Mouth (Patient not taking: Reported on 10/17/2022)     No current facility-administered medications for this visit.      REVIEW OF SYSTEMS (Negative unless checked)  Constitutional: Weight loss  Fever  Chills Cardiac: Chest pain   Chest pressure   Palpitations   Shortness of breath when laying flat   Shortness of breath at rest   Shortness of breath with exertion. Vascular:  Pain in legs with walking   Pain in legs at rest   Pain in legs when laying flat   Claudication   Pain in feet when walking  Pain in feet at rest  Pain in feet when laying flat   History of DVT   Phlebitis   Swelling in legs   Varicose veins   Non-healing ulcers Pulmonary:   Uses home oxygen   Productive cough   Hemoptysis   Wheeze  COPD   Asthma Neurologic:  Dizziness  Blackouts   Seizures   History of stroke   History of TIA  Aphasia   Temporary blindness   Dysphagia   Weakness or numbness in arms   Weakness or numbness in legs Musculoskeletal:  Arthritis   Joint swelling   Joint pain   Low back pain Hematologic:  Easy bruising  Easy bleeding   Hypercoagulable state   Anemic  Hepatitis Gastrointestinal:  Blood in stool   Vomiting blood  Gastroesophageal reflux/heartburn   Abdominal pain Genitourinary:  Chronic kidney disease   Difficult urination  Frequent urination  Burning with urination   Hematuria Skin:  Rashes   Ulcers   Wounds Psychological:  History of anxiety    History of major depression.    Physical Exam BP 126/75 (BP Location: Left Arm)   Pulse 94   Resp 18   Ht  (1.854 m)   Wt 147 lb (66.7 kg)   BMI 19.39 kg/m  Gen:   NAD. Debilitated and appears much older than stated age. Head: Welsh/AT, No temporalis wasting. Prominent temp pulse not noted. Ear/Nose/Throat: Hearing grossly intact, nares w/o erythema or drainage, oropharynx w/o Erythema/Exudate Eyes: Conjunctiva clear,  sclera non-icteric  Neck: trachea midline.  No JVD.  Pulmonary:  Good air movement, respirations not labored, no use of accessory muscles  Cardiac: RRR, no JVD Vascular:  Vessel Right Left  Radial Palpable Palpable                                   Gastrointestinal:. No masses, surgical incisions, or scars. Musculoskeletal: M/S 5/5 throughout.  Spastic.  In a wheelchair.  Has a small pressure area on the left heel without open wound currently. Neurologic: Sensation grossly intact in extremities. Spastic and tight.  Psychiatric: Judgment and insight appear poor Dermatologic: small pressure area on the left heel without skin opening today.    Radiology No results found.  Labs No results found for this or any previous visit (from the past 2160 hour(s)).  Assessment/Plan:  Heel pain  Venous duplex showed no evidence of DVT or superficial thrombophlebitis.  Arterial duplex demonstrated only some tibial level disease that did not appear to be particularly severe by the studies.  I do not think that the primary cause of the pain is vascular in origin.  I have referred him to podiatry to further valuate his foot pain.  If he does need any procedures done on his foot, he may need an angiogram for evaluation of his tibial disease to ensure he has adequate flow for wound healing.  Protein-calorie malnutrition (HCC) His poor nutrition and bedridden status have made him a high set up for heel ulceration and possible limb loss.  Factor 5 Leiden mutation, heterozygous (HCC) Has had previous DVTs but does not actually have much swelling or postphlebitic symptoms.  Did not have a DVT on the recent study.  He is on anticoagulation.      Festus Barren 10/17/2022, 11:41 AM   This note was created with Dragon medical transcription system.  Any errors from dictation are unintentional.

## 2022-10-17 NOTE — Assessment & Plan Note (Signed)
Has had previous DVTs but does not actually have much swelling or postphlebitic symptoms.  Did not have a DVT on the recent study.  He is on anticoagulation.

## 2022-10-17 NOTE — Assessment & Plan Note (Signed)
His poor nutrition and bedridden status have made him a high set up for heel ulceration and possible limb loss.

## 2022-10-17 NOTE — Assessment & Plan Note (Signed)
Venous duplex showed no evidence of DVT or superficial thrombophlebitis.  Arterial duplex demonstrated only some tibial level disease that did not appear to be particularly severe by the studies.  I do not think that the primary cause of the pain is vascular in origin.  I have referred him to podiatry to further valuate his foot pain.  If he does need any procedures done on his foot, he may need an angiogram for evaluation of his tibial disease to ensure he has adequate flow for wound healing.

## 2023-02-18 ENCOUNTER — Emergency Department: Payer: Medicare (Managed Care)

## 2023-02-18 ENCOUNTER — Emergency Department
Admission: EM | Admit: 2023-02-18 | Discharge: 2023-02-18 | Disposition: A | Discharge status: Home / Self Care | Payer: Medicare (Managed Care) | Attending: Emergency Medicine | Admitting: Emergency Medicine

## 2023-02-18 ENCOUNTER — Other Ambulatory Visit: Payer: Self-pay

## 2023-02-18 DIAGNOSIS — S0990XA Unspecified injury of head, initial encounter: Secondary | ICD-10-CM | POA: Insufficient documentation

## 2023-02-18 DIAGNOSIS — J449 Chronic obstructive pulmonary disease, unspecified: Secondary | ICD-10-CM | POA: Insufficient documentation

## 2023-02-18 DIAGNOSIS — W19XXXA Unspecified fall, initial encounter: Secondary | ICD-10-CM

## 2023-02-18 DIAGNOSIS — Z7901 Long term (current) use of anticoagulants: Secondary | ICD-10-CM | POA: Diagnosis not present

## 2023-02-18 DIAGNOSIS — F0390 Unspecified dementia without behavioral disturbance: Secondary | ICD-10-CM | POA: Diagnosis not present

## 2023-02-18 DIAGNOSIS — W06XXXA Fall from bed, initial encounter: Secondary | ICD-10-CM | POA: Insufficient documentation

## 2023-02-18 DIAGNOSIS — S299XXA Unspecified injury of thorax, initial encounter: Secondary | ICD-10-CM | POA: Diagnosis present

## 2023-02-18 DIAGNOSIS — S2232XA Fracture of one rib, left side, initial encounter for closed fracture: Secondary | ICD-10-CM | POA: Insufficient documentation

## 2023-02-18 NOTE — ED Provider Notes (Signed)
[facility] Provider Note    Event Date/Time   First MD Initiated Contact with Patient 02/18/23 [phone]     (approximate)   History   Fall   HPI  Cory Moses is a 62 y.o. male with a fairly extensive history of debility, vascular dementia, neurocognitive disorders, COPD, chronic anticoagulation with factor V mutation, and wheelchair dependence  He is here with his wife.  He is nonmobile at baseline she assist him with getting up and down with assistance of devices at home and he follows with the pace program.  Early this morning he fell out of bed.  He and his wife are joking about it because he reports that he was scared he had a dream that he was being attacked by giant snake caused him to roll out of the bed and fall to the floor.  They watched a movie about snakes last night  He reports that he struck the back of his head is a little bit sore.  There is no loss of consciousness.  There is no neck pain.  He also reports a little bit of an achy discomfort when he takes a deep breath in his left upper rib cage.  He has a history of multiple compression fractures and rib fractures in the past due to falls.  He is and effectively anticoagulated.  No chest pressure no nausea or vomiting no loss consciousness.  He is otherwise in his normal health but because of his use of a blood thinner 1 to make certain his CT scan was normal  He denies any shortness of breath fevers chills or cough.     Physical Exam   Triage Vital Signs: ED Triage Vitals  Encounter Vitals Group     BP 02/18/23 0752 (!) 175/79     Systolic BP Percentile --      Diastolic BP Percentile --      Pulse Rate 02/18/23 0752 90     Resp 02/18/23 0752 15     Temp 02/18/23 0752 97.9 F (36.6 C)     Temp Source 02/18/23 0752 Oral     SpO2 02/18/23 0752 94 %     Weight 02/18/23 0751 133 lb (60.3 kg)     Height 02/18/23 0751 6' (1.829 m)     Head Circumference --      Peak Flow --       Pain Score 02/18/23 0748 8     Pain Loc --      Pain Education --      Exclude from Growth Chart --     Most recent vital signs: Vitals:   02/18/23 0752  BP: (!) 175/79  Pulse: 90  Resp: 15  Temp: 97.9 F (36.6 C)  SpO2: 94%     General: Awake, no distress.  Normocephalic atraumatic.  Mild tenderness across the posterior occiput but no noted edema or hematoma CV:  Good peripheral perfusion.  Normal tones and rate Resp:  Normal effort.  Clear bilateral.  Reports that achy discomfort over the left posterior axillary region of the chest to palpation.  Work of breathing appears comfortable at this time with normal oxygen saturation.  No rales Abd:  No distention.  Soft nontender nondistended Other:  Sits up, bear his own weight in the wheelchair without difficulty.  Does report pain is relatively well-controlled he took his methadone this morning   ED Results / Procedures / Treatments   Labs (all labs ordered are  listed, but only abnormal results are displayed) Labs Reviewed - No data to display   RADIOLOGY  CT head interpreted by me as negative for acute gross intracranial pathology or hemorrhage  Chest x-ray interpreted by me, patient with kyphosis and fairly shallow inspiration on film, question if patient could have contusion edema or perhaps just scarring or atelectasis in the left side.  But given the pleuritic discomfort and his fall will obtain CT to further delineate and evaluate for causes such as pulmonary contusion, rib fracture, infiltrate etc.  CT Chest Wo Contrast  Result Date: 02/18/2023 CLINICAL DATA:  Posttraumatic pain left lateral chest wall after fall. Possible rib fractures. Shortness of breath. EXAM: CT CHEST WITHOUT CONTRAST TECHNIQUE: Multidetector CT imaging of the chest was performed following the standard protocol without IV contrast. RADIATION DOSE REDUCTION: This exam was performed according to the departmental dose-optimization program which  includes automated exposure control, adjustment of the mA and/or kV according to patient size and/or use of iterative reconstruction technique. COMPARISON:  MRI thoracic spine 03/02/2014 FINDINGS: Cardiovascular: Heart is normal size. Calcified plaque over the left main and 3 vessel coronary arteries. Thoracic aorta is normal in caliber. Minimal calcified plaque over the thoracic aorta. Pulmonary arterial system is unremarkable. Remaining vascular structures are within normal. Mediastinum/Nodes: No significant mediastinal or hilar adenopathy. Remaining mediastinal structures are unremarkable. Lungs/Pleura: Lungs are adequately inflated. Mild centrilobular emphysematous disease. Minimal patchy linear atelectasis/scarring bilaterally. Couple tiny calcified granulomas over the left lung. No acute airspace process or effusion. No pneumothorax. Calcified pleural plaque over the posterior left thorax. Upper Abdomen: Calcified plaque over the abdominal aorta. 3.8 cm Bosniak 1 cyst over the upper pole right kidney. No further imaging follow-up recommended. No acute findings. Musculoskeletal: Subtle fracture of the anterolateral left second rib with questionable nondisplaced acute versus chronic fractures of lateral left third through ninth ribs. Numerous compression fractures throughout the thoracic and visualized upper lumbar spine. These are predominantly unchanged compared to previous MRI 2015. IMPRESSION: 1. Subtle fracture of the anterolateral left second rib with questionable nondisplaced acute versus chronic fractures of lateral left third through ninth ribs. No pneumothorax. 2. Mild centrilobular emphysematous disease with minimal patchy linear atelectasis/scarring bilaterally. 3. Aortic atherosclerosis. Atherosclerotic coronary artery disease. 4. 3.8 cm Bosniak 1 cyst over the upper pole right kidney. No further imaging follow-up recommended. 5. Numerous compression fractures throughout the thoracic and visualized  upper lumbar spine predominantly unchanged compared to previous MRI 2015. Aortic Atherosclerosis (ICD10-I70.0) and Emphysema (ICD10-J43.9). Electronically Signed   By: Elberta Fortis M.D.   On: 02/18/2023 09:46   DG Chest 1 View  Result Date: 02/18/2023 CLINICAL DATA:  Mechanical fall. EXAM: CHEST  1 VIEW COMPARISON:  01/02/2017 FINDINGS: Low volume chest with streaky right perihilar opacity and hazy left perihilar density. No effusion or pneumothorax. Stable heart size and mediastinal contours when accounting for rotation and low lung volumes. IMPRESSION: Low volume chest with atelectasis and left perihilar infiltrate. Electronically Signed   By: Tiburcio Pea M.D.   On: 02/18/2023 09:02   CT Head Wo Contrast  Result Date: 02/18/2023 CLINICAL DATA:  Neck trauma.  Fall from bed EXAM: CT HEAD WITHOUT CONTRAST CT CERVICAL SPINE WITHOUT CONTRAST TECHNIQUE: Multidetector CT imaging of the head and cervical spine was performed following the standard protocol without intravenous contrast. Multiplanar CT image reconstructions of the cervical spine were also generated. RADIATION DOSE REDUCTION: This exam was performed according to the departmental dose-optimization program which includes automated exposure control, adjustment of the  mA and/or kV according to patient size and/or use of iterative reconstruction technique. COMPARISON:  /11/19/2020 FINDINGS: CT HEAD FINDINGS Brain: No evidence of acute infarction, hemorrhage, obstructive hydrocephalus, extra-axial collection or mass lesion/mass effect. Chronic small vessel ischemia in the cerebral white matter. Disproportionate subarachnoid spaces and callosum angle narrowing, as mentioned previously this pattern of ventriculomegaly can be seen with normal pressure hydrocephalus. Vascular: No hyperdense vessel or unexpected calcification. Skull: Normal. Negative for fracture or focal lesion. Sinuses/Orbits: No evidence of injury CT CERVICAL SPINE FINDINGS Alignment:  Normal. Skull base and vertebrae: No acute fracture. Subjective osteopenia. Possible chronic superior endplate fracture at T1 and T2. Soft tissues and spinal canal: No prevertebral fluid or swelling. No visible canal hematoma. Disc levels:  No significant degenerative changes for age Upper chest: No acute finding IMPRESSION: No evidence of acute intracranial or cervical spine injury. Electronically Signed   By: Tiburcio Pea M.D.   On: 02/18/2023 08:34   CT Cervical Spine Wo Contrast  Result Date: 02/18/2023 CLINICAL DATA:  Neck trauma.  Fall from bed EXAM: CT HEAD WITHOUT CONTRAST CT CERVICAL SPINE WITHOUT CONTRAST TECHNIQUE: Multidetector CT imaging of the head and cervical spine was performed following the standard protocol without intravenous contrast. Multiplanar CT image reconstructions of the cervical spine were also generated. RADIATION DOSE REDUCTION: This exam was performed according to the departmental dose-optimization program which includes automated exposure control, adjustment of the mA and/or kV according to patient size and/or use of iterative reconstruction technique. COMPARISON:  /11/19/2020 FINDINGS: CT HEAD FINDINGS Brain: No evidence of acute infarction, hemorrhage, obstructive hydrocephalus, extra-axial collection or mass lesion/mass effect. Chronic small vessel ischemia in the cerebral white matter. Disproportionate subarachnoid spaces and callosum angle narrowing, as mentioned previously this pattern of ventriculomegaly can be seen with normal pressure hydrocephalus. Vascular: No hyperdense vessel or unexpected calcification. Skull: Normal. Negative for fracture or focal lesion. Sinuses/Orbits: No evidence of injury CT CERVICAL SPINE FINDINGS Alignment: Normal. Skull base and vertebrae: No acute fracture. Subjective osteopenia. Possible chronic superior endplate fracture at T1 and T2. Soft tissues and spinal canal: No prevertebral fluid or swelling. No visible canal hematoma. Disc  levels:  No significant degenerative changes for age Upper chest: No acute finding IMPRESSION: No evidence of acute intracranial or cervical spine injury. Electronically Signed   By: Tiburcio Pea M.D.   On: 02/18/2023 08:34    PROCEDURES:  Critical Care performed: No  Procedures   MEDICATIONS ORDERED IN ED: Medications - No data to display   IMPRESSION / MDM / ASSESSMENT AND PLAN / ED COURSE  I reviewed the triage vital signs and the nursing notes.                              Differential diagnosis includes, but is not limited to, closed head injury need to exclude intracranial hemorrhage, also consideration for musculoskeletal pain, contusion, rib fracture etc. are given.  No midline back pain.  Thankfully CT of the head and cervical spine are without acute fracture.  Given his chest x-ray obtained CT chest without contrast to further evaluate for acute chest pathology.  He lacks any signs or symptoms suggestive of pneumonia or developing infectious illness at this time.  He is at his cognitive and mobile baseline at this time.  He has taken his methadone which helps alleviate discomfort and is not any severe pain or discomfort at this time.  Patient's presentation is most consistent with acute  complicated illness / injury requiring diagnostic workup.   ----------------------------------------- 2:03 PM on 02/18/2023 ----------------------------------------- Patient resting comfortably.  Has a history of multiple rib fractures from falls in the past.  Discussed with patient and his wife updated them on suspicion of new left second rib fracture, this is in the area the patient reports pain and discomfort.  I do not have suspicion that he has multiple new rib fractures 2 through 9  Discussed use of spirometer very minor measuring for signs and symptoms of developing infection or distress or complication.  Patient is family agreeable to follow-up with his clinician, wife advised they  follow with the pace clinic.  I did call the pace clinic but pace clinic advises they do not have record in their system but they anticipate he may go to the [place] pace clinic.  Seems to be some confusion among the pace phone numbers or operator whom I called, but nonetheless anticipate the patient can follow-up with the pace clinic.  Return precautions and treatment recommendations and follow-up discussed with the patient who is agreeable with the plan.        FINAL CLINICAL IMPRESSION(S) / ED DIAGNOSES   Final diagnoses:  Fall, initial encounter  Closed fracture of one rib of left side, initial encounter  Injury of head, initial encounter     Rx / DC Orders   ED Discharge Orders     None        Note:  This document was prepared using Dragon voice recognition software and may include unintentional dictation errors.   Sharyn Creamer, MD 02/18/23 [phone]

## 2023-02-18 NOTE — ED Notes (Signed)
Pt given incentive spirometer with teaching to pt and caregiver how to use it.

## 2023-02-18 NOTE — Discharge Instructions (Addendum)
Please follow-up and continue to follow closely with your doctor at the [facility] clinic.  Please follow-up with them tomorrow  Utilize incentive spirometer every 15 minutes while up and awake.  Return to the ER if severe pain, difficulty breathing, fevers chills chest pain confusion severe headache vomiting or other concerns arise.

## 2023-02-18 NOTE — ED Triage Notes (Signed)
Pt to ED [facility] from home for mechanical fall from bed to floor 3' distance this AM, no LOC Complains of posterior head pain Takes eliquis  Non ambulatory at baseline, wheelchair bound  64 HR, 192/82, no other VS  Pt alert, oriented. Complains of pain to L lateral chest with deep breathing  Pt takes methadone, 10mg  TID, took morning dose PTA, wife also gave him 2mg  klonapin this AM

## 2023-03-07 ENCOUNTER — Ambulatory Visit: Payer: Medicare (Managed Care) | Admitting: Urology

## 2023-03-13 ENCOUNTER — Ambulatory Visit: Payer: Medicare (Managed Care) | Admitting: Urology

## 2023-03-20 ENCOUNTER — Ambulatory Visit: Payer: Medicare (Managed Care) | Admitting: Urology

## 2023-03-28 ENCOUNTER — Ambulatory Visit: Payer: Medicare (Managed Care) | Admitting: Urology

## 2023-08-21 ENCOUNTER — Ambulatory Visit: Payer: Medicare (Managed Care) | Admitting: Urology

## 2023-08-24 ENCOUNTER — Other Ambulatory Visit: Payer: Self-pay

## 2023-08-24 DIAGNOSIS — N400 Enlarged prostate without lower urinary tract symptoms: Secondary | ICD-10-CM

## 2023-08-29 ENCOUNTER — Ambulatory Visit (INDEPENDENT_AMBULATORY_CARE_PROVIDER_SITE_OTHER): Payer: Medicare (Managed Care) | Admitting: Urology

## 2023-08-29 VITALS — BP 147/82 | HR 65

## 2023-08-29 DIAGNOSIS — N400 Enlarged prostate without lower urinary tract symptoms: Secondary | ICD-10-CM | POA: Diagnosis not present

## 2023-08-29 MED ORDER — ALFUZOSIN HCL ER 10 MG PO TB24
10.0000 mg | ORAL_TABLET | Freq: Every day | ORAL | 3 refills | Status: DC
Start: 2023-08-29 — End: 2023-08-31

## 2023-08-29 MED ORDER — DUTASTERIDE 0.5 MG PO CAPS
0.5000 mg | ORAL_CAPSULE | Freq: Every day | ORAL | 3 refills | Status: AC
Start: 2023-08-29 — End: ?

## 2023-08-29 NOTE — Progress Notes (Signed)
   08/29/2023 4:21 PM   Roosevelt Locks 1960-07-31 [id number]  Reason for visit: Follow up urinary symptoms  HPI: 63 year old male with numerous comorbidities including vascular dementia, neurocognitive disorders, COPD, chronic anticoagulation with factor V mutation, and wheelchair dependence.  He is here with his wife again today who provides most of the history.  I previously saw him in December 2023 when he was doing well on maximal medical therapy for BPH with alfuzosin and dutasteride, PVR was normal, he was not having any incontinence, and was having nocturia 0-1 time overnight.  He previously had been on Flomax instead of alfuzosin with no improvement in his weak stream.  At some point in the last year for unclear reasons his alfuzosin was discontinued and changed to Flomax.  Not surprisingly he has had increased problems with some weak stream and straining to initiate urination.  Overall symptoms are mild and he denies any incontinence, dysuria, gross hematuria, or nocturia.  PVR today normal at 70ml.  With his other comorbidities and overall frailty adequately never reasonable and realistic expectations, however strongly recommended returning to the alfuzosin and stopping the Flomax which was helpful in the past.  Can continue dutasteride.  RTC 1 year, sooner if no improvement  Sondra Come, MD  [facility] [address] [place], [place] [address] [phone]

## 2023-08-29 NOTE — Patient Instructions (Signed)
 It looks like at some point your prostate medications have been changed over the last year, which may cause worsening of your symptoms.  From my last note you were doing better when you were taking dutasteride and alfuzosin.  At some point the alfuzosin was changed to Flomax(tamsulosin), which from our prior notes was not as helpful.  You can stop the Flomax and take the alfuzosin and dutasteride.

## 2023-08-31 ENCOUNTER — Other Ambulatory Visit: Payer: Self-pay | Admitting: Family Medicine

## 2023-08-31 DIAGNOSIS — L97909 Non-pressure chronic ulcer of unspecified part of unspecified lower leg with unspecified severity: Secondary | ICD-10-CM

## 2023-08-31 MED ORDER — ALFUZOSIN HCL ER 10 MG PO TB24
10.0000 mg | ORAL_TABLET | Freq: Every day | ORAL | 3 refills | Status: AC
Start: 2023-08-31 — End: ?

## 2023-08-31 NOTE — Addendum Note (Signed)
 Addended by: Consuella Lose on: 08/31/2023 09:43 AM   Modules accepted: Orders

## 2023-09-07 ENCOUNTER — Ambulatory Visit: Payer: Medicare (Managed Care)

## 2023-09-12 ENCOUNTER — Ambulatory Visit
Admission: RE | Admit: 2023-09-12 | Discharge: 2023-09-12 | Disposition: A | Discharge status: Home / Self Care | Payer: Medicare (Managed Care) | Source: Ambulatory Visit | Attending: Family Medicine | Admitting: Family Medicine

## 2023-09-12 DIAGNOSIS — L97909 Non-pressure chronic ulcer of unspecified part of unspecified lower leg with unspecified severity: Secondary | ICD-10-CM | POA: Diagnosis present

## 2023-09-17 ENCOUNTER — Ambulatory Visit: Payer: Medicare (Managed Care)

## 2023-09-18 ENCOUNTER — Ambulatory Visit: Payer: Medicare (Managed Care)

## 2023-10-30 ENCOUNTER — Encounter (INDEPENDENT_AMBULATORY_CARE_PROVIDER_SITE_OTHER): Payer: Self-pay | Admitting: Vascular Surgery

## 2023-10-30 ENCOUNTER — Ambulatory Visit (INDEPENDENT_AMBULATORY_CARE_PROVIDER_SITE_OTHER): Payer: Medicare (Managed Care) | Admitting: Vascular Surgery

## 2023-10-30 VITALS — BP 121/76 | HR 91 | Resp 16 | Wt 147.0 lb

## 2023-10-30 DIAGNOSIS — I7025 Atherosclerosis of native arteries of other extremities with ulceration: Secondary | ICD-10-CM | POA: Diagnosis not present

## 2023-10-30 DIAGNOSIS — D6851 Activated protein C resistance: Secondary | ICD-10-CM | POA: Diagnosis not present

## 2023-10-30 DIAGNOSIS — I10 Essential (primary) hypertension: Secondary | ICD-10-CM

## 2023-10-30 DIAGNOSIS — E46 Unspecified protein-calorie malnutrition: Secondary | ICD-10-CM | POA: Diagnosis not present

## 2023-10-30 NOTE — Progress Notes (Unsigned)
 Patient ID: Cory Moses, male   DOB: [birth year], 63 y.o.   MRN: [id number]  Chief Complaint  Patient presents with   Follow-up    Ref Cory Moses consult non-pressure chronic ulcer of unspecified part of lower extremity    HPI Cory Moses is a 63 y.o. male.  I am asked to see the patient by Dr. Izella Moses for evaluation of nonhealing ulcerations of both lower extremities.  His left lower extremity ulceration started first couple of months ago.  This was occurring without an obvious trauma or injury.  There is no clear inciting cause.  Over the past several weeks, also developed ulceration and he has impending ulceration on the lateral aspect of the left lower extremity as well.  The original ulceration was on the medial aspect of the left lower leg.  He has a lot of fibrinous exudate and drainage.  These do not appear to be terribly painful to him.  He has not been ambulatory in about a year.  He has a litany of other medical issues as listed below.  He has had no fever or chills.  He is on anticoagulation for hypercoagulable state.   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    Past Medical History:  Diagnosis Date   COPD (chronic obstructive pulmonary disease) (HCC)    Seizures (HCC)    Vascular dementia (HCC)     Past Surgical History:  Procedure Laterality Date   BACK SURGERY     LEG SURGERY Bilateral    legs was broken    Family History No history of bleeding disorders, clotting disorders, or aneurysms   Social History   Tobacco Use   Smoking status: Former    Types: Cigarettes    Passive exposure: Current   Smokeless tobacco: Never  Substance Use Topics   Alcohol use: No   Drug use: No     Allergies  Allergen Reactions   Lyrica [Pregabalin]    Morphine Other (See Comments)    Other reaction(s): Other (See Comments),  Other (See Comments)  Doesn't like Morphine, mental status changes.  Doesn't like Morphine  Doesn't like Morphine, mental status changes.    Doesn't like Morphine    Other reaction(s): Other (See Comments), Other (See Comments) Doesn't like Morphine, mental status changes. Doesn't like Morphine  Other Reaction(s): Other (See Comments)  Doesn't like Morphine, mental status changes.    Doesn't like Morphine    Other reaction(s): Other (See Comments), Other (See Comments) Doesn't like Morphine, mental status changes. Doesn't like Morphine    Other reaction(s): Other (See Comments), Other (See Comments)  Doesn't like Morphine, mental status changes.  Doesn't like Morphine  Doesn't like Morphine, mental status changes.  Doesn't like Morphine  Other reaction(s): Other (See Comments), Other (See Comments) Doesn't like Morphine, mental status changes. Doesn't like Morphine   Morphine And Codeine Anxiety    Current Outpatient Medications  Medication Sig Dispense Refill   albuterol (PROVENTIL HFA;VENTOLIN HFA) 108 (90 Base) MCG/ACT inhaler Inhale 2 puffs into the lungs every 4 (four) hours as needed for wheezing or shortness of breath.     alfuzosin  (UROXATRAL ) 10 MG 24 hr tablet Take 1 tablet (10 mg total) by mouth daily with breakfast. 90 tablet 3   amLODipine (NORVASC) 10 MG tablet Take 10 mg by mouth daily.     apixaban  (  ELIQUIS ) 5 MG TABS tablet Take 5 mg by mouth 2 (two) times daily.     Ascorbic Acid (VITAMIN C) 250 MG CHEW Chew by mouth.     ASPERCREME LIDOCAINE 4 % CREA Apply topically.     BEN GAY ULTRA STRENGTH 10-10-28 % CREA Apply topically.     bisacodyl (DULCOLAX) 10 MG suppository Place 10 mg rectally.     Budeson-Glycopyrrol-Formoterol (BREZTRI AEROSPHERE) 160-9-4.8 MCG/ACT AERO Inhale into the lungs.     Calcium Carb-Cholecalciferol 600-10 MG-MCG TABS Take 1 tablet by mouth 2 (two) times daily.     clonazePAM  (KLONOPIN ) 0.5 MG tablet Take 1 mg by mouth 2 (two) times daily.      dutasteride  (AVODART ) 0.5 MG capsule Take 1 capsule (0.5 mg total) by mouth daily. 90 capsule 3   Ensure (ENSURE) Take by mouth.     esomeprazole (NEXIUM) 40 MG capsule Take 40 mg by mouth daily.     FLUoxetine (PROZAC) 40 MG capsule Take 40 mg by mouth daily.     fluticasone (FLONASE) 50 MCG/ACT nasal spray Place into both nostrils.     gabapentin  (NEURONTIN ) 600 MG tablet Take 600 mg by mouth daily.     GAS RELIEF EXTRA STRENGTH 125 MG chewable tablet Chew 125 mg by mouth.     GERI-TUSSIN 100 MG/5ML liquid      hydrocortisone cream 1 % Apply topically.     lactulose (CHRONULAC) 10 GM/15ML solution      lisinopril  (ZESTRIL ) 2.5 MG tablet Take 2.5 mg by mouth daily.     loperamide (IMODIUM) 2 MG capsule Take 2 mg by mouth 2 (two) times daily.     lubiprostone (AMITIZA) 8 MCG capsule Take 8 mcg by mouth.     melatonin 3 MG TABS tablet Take by mouth.     methadone  (DOLOPHINE ) 10 MG tablet Take 10 mg by mouth 3 (three) times daily as needed.     mirtazapine (REMERON) 7.5 MG tablet Take 7.5 mg by mouth at bedtime.     nystatin (MYCOSTATIN/NYSTOP) powder Apply 1 Application topically 2 (two) times daily.     OXYGEN 2 L at bedtime.     polyethylene glycol powder (GLYCOLAX/MIRALAX) 17 GM/SCOOP powder Take 17 g by mouth daily.     risperiDONE (RISPERDAL) 1 MG tablet Take by mouth.     senna-docusate (SENOKOT-S) 8.6-50 MG tablet Take by mouth.     thiamine 100 MG tablet Take 100 mg by mouth daily.     No current facility-administered medications for this visit.      REVIEW OF SYSTEMS (Negative unless checked)  Constitutional: [] Weight loss  [] Fever  [] Chills Cardiac: [] Chest pain   [] Chest pressure   [] Palpitations   [] Shortness of breath when laying flat   [] Shortness of breath at rest   [] Shortness of breath with exertion. Vascular:  [] Pain in legs with walking   [] Pain in legs at rest   [] Pain in legs when laying flat   [] Claudication   [] Pain in feet when walking  [] Pain in feet at  rest  [] Pain in feet when laying flat   [] History of DVT   [] Phlebitis   [] Swelling in legs   [] Varicose veins   [] Non-healing ulcers Pulmonary:   [] Uses home oxygen   [] Productive cough   [] Hemoptysis   [] Wheeze  [x] COPD   [] Asthma Neurologic:  [] Dizziness  [] Blackouts   [x] Seizures   [] History of stroke   [] History of TIA  [] Aphasia   [] Temporary blindness   [] Dysphagia   []   Weakness or numbness in arms   [] Weakness or numbness in legs Musculoskeletal:  [] Arthritis   [] Joint swelling   [] Joint pain   [] Low back pain Hematologic:  [] Easy bruising  [] Easy bleeding   [] Hypercoagulable state   [] Anemic  [] Hepatitis Gastrointestinal:  [] Blood in stool   [] Vomiting blood  [] Gastroesophageal reflux/heartburn   [] Abdominal pain Genitourinary:  [] Chronic kidney disease   [] Difficult urination  [] Frequent urination  [] Burning with urination   [] Hematuria Skin:  [] Rashes   [x] Ulcers   [x] Wounds Psychological:  [] History of anxiety   []  History of major depression.    Physical Exam BP 121/76   Pulse 91   Resp 16   Wt 147 lb (66.7 kg)   BMI 19.94 kg/m  Gen:  WD/WN, NAD. Appears older than stated age. Head: [place]/AT, + temporalis wasting.  Ear/Nose/Throat: Hearing grossly intact, nares w/o erythema or drainage, oropharynx w/o Erythema/Exudate Eyes: Conjunctiva clear, sclera non-icteric  Neck: trachea midline.  No JVD.  Pulmonary:  Good air movement, respirations not labored, no use of accessory muscles  Cardiac: RRR, no JVD Vascular:  Vessel Right Left  Radial Palpable Palpable                          DP    PT     Gastrointestinal:. No masses, surgical incisions, or scars. Musculoskeletal: Generalized weakness.  In a wheelchair.  Wounds as described below.  1+ bilateral lower extremity edema. Neurologic: Sensation grossly intact in extremities.  Generalized weakness but symmetrical. Psychiatric: Judgment intact, Mood & affect appropriate for pt's clinical situation. Dermatologic: Open  ulceration on the medial left lower leg in the lateral right lower leg with significant fibrinous exudate, pale wound base with poor granulation tissue and mild surrounding erythema on each wound.  He has early skin changes on the lateral aspect of the left leg as well as the more medial aspect of the right leg also worrisome for impending ulceration.      Radiology No results found.  Labs No results found for this or any previous visit (from the past 2160 hours).  Assessment/Plan:  Essential hypertension blood pressure control important in reducing the progression of atherosclerotic disease. On appropriate oral medications.   Factor 5 Leiden mutation, heterozygous (HCC) On anticoagulation.  Atherosclerosis of native arteries of the extremities with ulceration (HCC) The patient has severe nonhealing ulcerations on both lower extremities.  This is clearly a critical limb threatening situation.  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  Given this finding with worsening nonhealing ulceration, I would recommend lower extremity angiography with possible intervention.  The left lower extremity will be evaluated potentially treated first as this is the worst of the 2 legs, with a plan for a right lower extremity angiogram for further evaluation in the future given the worsening wounds on the right leg as well.  I discussed with he and his wife the critical nature of the situation with a high risk of limb loss.  I discussed the reason and rationale for angiogram and other treatments.  I discussed that even with successful revascularization, limb loss is possible.  They voiced their understanding and desire to proceed.      Cory Moses 10/30/2023, 3:58 PM   This note was created with Dragon medical transcription system.  Any errors from dictation are unintentional.

## 2023-10-30 NOTE — Assessment & Plan Note (Signed)
On anticoagulation 

## 2023-10-30 NOTE — H&P (View-Only) (Signed)
 Patient ID: Cory Moses, male   DOB: [birth year], 63 y.o.   MRN: [id number]  Chief Complaint  Patient presents with   Follow-up    Ref Cory Moses consult non-pressure chronic ulcer of unspecified part of lower extremity    HPI Cory Moses is a 63 y.o. male.  I am asked to see the patient by Dr. Izella Moses for evaluation of nonhealing ulcerations of both lower extremities.  His left lower extremity ulceration started first couple of months ago.  This was occurring without an obvious trauma or injury.  There is no clear inciting cause.  Over the past several weeks, also developed ulceration and he has impending ulceration on the lateral aspect of the left lower extremity as well.  The original ulceration was on the medial aspect of the left lower leg.  He has a lot of fibrinous exudate and drainage.  These do not appear to be terribly painful to him.  He has not been ambulatory in about a year.  He has a litany of other medical issues as listed below.  He has had no fever or chills.  He is on anticoagulation for hypercoagulable state.   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    Past Medical History:  Diagnosis Date   COPD (chronic obstructive pulmonary disease) (HCC)    Seizures (HCC)    Vascular dementia (HCC)     Past Surgical History:  Procedure Laterality Date   BACK SURGERY     LEG SURGERY Bilateral    legs was broken    Family History No history of bleeding disorders, clotting disorders, or aneurysms   Social History   Tobacco Use   Smoking status: Former    Types: Cigarettes    Passive exposure: Current   Smokeless tobacco: Never  Substance Use Topics   Alcohol use: No   Drug use: No     Allergies  Allergen Reactions   Lyrica [Pregabalin]    Morphine Other (See Comments)    Other reaction(s): Other (See Comments),  Other (See Comments)  Doesn't like Morphine, mental status changes.  Doesn't like Morphine  Doesn't like Morphine, mental status changes.    Doesn't like Morphine    Other reaction(s): Other (See Comments), Other (See Comments) Doesn't like Morphine, mental status changes. Doesn't like Morphine  Other Reaction(s): Other (See Comments)  Doesn't like Morphine, mental status changes.    Doesn't like Morphine    Other reaction(s): Other (See Comments), Other (See Comments) Doesn't like Morphine, mental status changes. Doesn't like Morphine    Other reaction(s): Other (See Comments), Other (See Comments)  Doesn't like Morphine, mental status changes.  Doesn't like Morphine  Doesn't like Morphine, mental status changes.  Doesn't like Morphine  Other reaction(s): Other (See Comments), Other (See Comments) Doesn't like Morphine, mental status changes. Doesn't like Morphine   Morphine And Codeine Anxiety    Current Outpatient Medications  Medication Sig Dispense Refill   albuterol (PROVENTIL HFA;VENTOLIN HFA) 108 (90 Base) MCG/ACT inhaler Inhale 2 puffs into the lungs every 4 (four) hours as needed for wheezing or shortness of breath.     alfuzosin  (UROXATRAL ) 10 MG 24 hr tablet Take 1 tablet (10 mg total) by mouth daily with breakfast. 90 tablet 3   amLODipine (NORVASC) 10 MG tablet Take 10 mg by mouth daily.     apixaban  (  ELIQUIS ) 5 MG TABS tablet Take 5 mg by mouth 2 (two) times daily.     Ascorbic Acid (VITAMIN C) 250 MG CHEW Chew by mouth.     ASPERCREME LIDOCAINE 4 % CREA Apply topically.     BEN GAY ULTRA STRENGTH 10-10-28 % CREA Apply topically.     bisacodyl (DULCOLAX) 10 MG suppository Place 10 mg rectally.     Budeson-Glycopyrrol-Formoterol (BREZTRI AEROSPHERE) 160-9-4.8 MCG/ACT AERO Inhale into the lungs.     Calcium Carb-Cholecalciferol 600-10 MG-MCG TABS Take 1 tablet by mouth 2 (two) times daily.     clonazePAM  (KLONOPIN ) 0.5 MG tablet Take 1 mg by mouth 2 (two) times daily.      dutasteride  (AVODART ) 0.5 MG capsule Take 1 capsule (0.5 mg total) by mouth daily. 90 capsule 3   Ensure (ENSURE) Take by mouth.     esomeprazole (NEXIUM) 40 MG capsule Take 40 mg by mouth daily.     FLUoxetine (PROZAC) 40 MG capsule Take 40 mg by mouth daily.     fluticasone (FLONASE) 50 MCG/ACT nasal spray Place into both nostrils.     gabapentin  (NEURONTIN ) 600 MG tablet Take 600 mg by mouth daily.     GAS RELIEF EXTRA STRENGTH 125 MG chewable tablet Chew 125 mg by mouth.     GERI-TUSSIN 100 MG/5ML liquid      hydrocortisone cream 1 % Apply topically.     lactulose (CHRONULAC) 10 GM/15ML solution      lisinopril  (ZESTRIL ) 2.5 MG tablet Take 2.5 mg by mouth daily.     loperamide (IMODIUM) 2 MG capsule Take 2 mg by mouth 2 (two) times daily.     lubiprostone (AMITIZA) 8 MCG capsule Take 8 mcg by mouth.     melatonin 3 MG TABS tablet Take by mouth.     methadone  (DOLOPHINE ) 10 MG tablet Take 10 mg by mouth 3 (three) times daily as needed.     mirtazapine (REMERON) 7.5 MG tablet Take 7.5 mg by mouth at bedtime.     nystatin (MYCOSTATIN/NYSTOP) powder Apply 1 Application topically 2 (two) times daily.     OXYGEN 2 L at bedtime.     polyethylene glycol powder (GLYCOLAX/MIRALAX) 17 GM/SCOOP powder Take 17 g by mouth daily.     risperiDONE (RISPERDAL) 1 MG tablet Take by mouth.     senna-docusate (SENOKOT-S) 8.6-50 MG tablet Take by mouth.     thiamine 100 MG tablet Take 100 mg by mouth daily.     No current facility-administered medications for this visit.      REVIEW OF SYSTEMS (Negative unless checked)  Constitutional: [] Weight loss  [] Fever  [] Chills Cardiac: [] Chest pain   [] Chest pressure   [] Palpitations   [] Shortness of breath when laying flat   [] Shortness of breath at rest   [] Shortness of breath with exertion. Vascular:  [] Pain in legs with walking   [] Pain in legs at rest   [] Pain in legs when laying flat   [] Claudication   [] Pain in feet when walking  [] Pain in feet at  rest  [] Pain in feet when laying flat   [] History of DVT   [] Phlebitis   [] Swelling in legs   [] Varicose veins   [] Non-healing ulcers Pulmonary:   [] Uses home oxygen   [] Productive cough   [] Hemoptysis   [] Wheeze  [x] COPD   [] Asthma Neurologic:  [] Dizziness  [] Blackouts   [x] Seizures   [] History of stroke   [] History of TIA  [] Aphasia   [] Temporary blindness   [] Dysphagia   []   Weakness or numbness in arms   [] Weakness or numbness in legs Musculoskeletal:  [] Arthritis   [] Joint swelling   [] Joint pain   [] Low back pain Hematologic:  [] Easy bruising  [] Easy bleeding   [] Hypercoagulable state   [] Anemic  [] Hepatitis Gastrointestinal:  [] Blood in stool   [] Vomiting blood  [] Gastroesophageal reflux/heartburn   [] Abdominal pain Genitourinary:  [] Chronic kidney disease   [] Difficult urination  [] Frequent urination  [] Burning with urination   [] Hematuria Skin:  [] Rashes   [x] Ulcers   [x] Wounds Psychological:  [] History of anxiety   []  History of major depression.    Physical Exam BP 121/76   Pulse 91   Resp 16   Wt 147 lb (66.7 kg)   BMI 19.94 kg/m  Gen:  WD/WN, NAD. Appears older than stated age. Head: [place]/AT, + temporalis wasting.  Ear/Nose/Throat: Hearing grossly intact, nares w/o erythema or drainage, oropharynx w/o Erythema/Exudate Eyes: Conjunctiva clear, sclera non-icteric  Neck: trachea midline.  No JVD.  Pulmonary:  Good air movement, respirations not labored, no use of accessory muscles  Cardiac: RRR, no JVD Vascular:  Vessel Right Left  Radial Palpable Palpable                          DP    PT     Gastrointestinal:. No masses, surgical incisions, or scars. Musculoskeletal: Generalized weakness.  In a wheelchair.  Wounds as described below.  1+ bilateral lower extremity edema. Neurologic: Sensation grossly intact in extremities.  Generalized weakness but symmetrical. Psychiatric: Judgment intact, Mood & affect appropriate for pt's clinical situation. Dermatologic: Open  ulceration on the medial left lower leg in the lateral right lower leg with significant fibrinous exudate, pale wound base with poor granulation tissue and mild surrounding erythema on each wound.  He has early skin changes on the lateral aspect of the left leg as well as the more medial aspect of the right leg also worrisome for impending ulceration.      Radiology No results found.  Labs No results found for this or any previous visit (from the past 2160 hours).  Assessment/Plan:  Essential hypertension blood pressure control important in reducing the progression of atherosclerotic disease. On appropriate oral medications.   Factor 5 Leiden mutation, heterozygous (HCC) On anticoagulation.  Atherosclerosis of native arteries of the extremities with ulceration (HCC) The patient has severe nonhealing ulcerations on both lower extremities.  This is clearly a critical limb threatening situation.  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  Given this finding with worsening nonhealing ulceration, I would recommend lower extremity angiography with possible intervention.  The left lower extremity will be evaluated potentially treated first as this is the worst of the 2 legs, with a plan for a right lower extremity angiogram for further evaluation in the future given the worsening wounds on the right leg as well.  I discussed with he and his wife the critical nature of the situation with a high risk of limb loss.  I discussed the reason and rationale for angiogram and other treatments.  I discussed that even with successful revascularization, limb loss is possible.  They voiced their understanding and desire to proceed.      Mikki Alexander 10/30/2023, 3:58 PM   This note was created with Dragon medical transcription system.  Any errors from dictation are unintentional.

## 2023-10-30 NOTE — Assessment & Plan Note (Signed)
 Certainly affects wound healing.

## 2023-10-30 NOTE — Assessment & Plan Note (Signed)
 blood pressure control important in reducing the progression of atherosclerotic disease. On appropriate oral medications.

## 2023-10-30 NOTE — Assessment & Plan Note (Signed)
 The patient has severe nonhealing ulcerations on both lower extremities.  This is clearly a critical limb threatening situation.  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  Given this finding with worsening nonhealing ulceration, I would recommend lower extremity angiography with possible intervention.  The left lower extremity will be evaluated potentially treated first as this is the worst of the 2 legs, with a plan for a right lower extremity angiogram for further evaluation in the future given the worsening wounds on the right leg as well.  I discussed with he and his wife the critical nature of the situation with a high risk of limb loss.  I discussed the reason and rationale for angiogram and other treatments.  I discussed that even with successful revascularization, limb loss is possible.  They voiced their understanding and desire to proceed.

## 2023-10-31 ENCOUNTER — Telehealth (INDEPENDENT_AMBULATORY_CARE_PROVIDER_SITE_OTHER): Payer: Self-pay

## 2023-10-31 NOTE — Telephone Encounter (Signed)
 I spoke with Kya at [facility] and the patient is scheduled with Dr. Vonna Guardian for bilateral leg angio's. LLE (11/12/23 - 8:00 am arrival), RLE (02/19/24 - 8:00 am arrival) to the [facility]. Pre-procedure instructions were discussed and will be sent to [facility] at [facility].

## 2023-11-12 ENCOUNTER — Other Ambulatory Visit: Payer: Self-pay

## 2023-11-12 ENCOUNTER — Encounter: Payer: Self-pay | Admitting: Vascular Surgery

## 2023-11-12 ENCOUNTER — Ambulatory Visit
Admission: RE | Admit: 2023-11-12 | Discharge: 2023-11-12 | Disposition: A | Discharge status: Home / Self Care | Payer: Medicare (Managed Care) | Attending: Vascular Surgery | Admitting: Vascular Surgery

## 2023-11-12 ENCOUNTER — Encounter
Admission: RE | Disposition: A | Discharge status: Home / Self Care | Payer: Self-pay | Source: Home / Self Care | Attending: Vascular Surgery

## 2023-11-12 DIAGNOSIS — I70248 Atherosclerosis of native arteries of left leg with ulceration of other part of lower left leg: Secondary | ICD-10-CM | POA: Insufficient documentation

## 2023-11-12 DIAGNOSIS — I10 Essential (primary) hypertension: Secondary | ICD-10-CM | POA: Insufficient documentation

## 2023-11-12 DIAGNOSIS — I70249 Atherosclerosis of native arteries of left leg with ulceration of unspecified site: Secondary | ICD-10-CM

## 2023-11-12 DIAGNOSIS — D6851 Activated protein C resistance: Secondary | ICD-10-CM | POA: Diagnosis not present

## 2023-11-12 DIAGNOSIS — I70238 Atherosclerosis of native arteries of right leg with ulceration of other part of lower right leg: Secondary | ICD-10-CM | POA: Insufficient documentation

## 2023-11-12 DIAGNOSIS — L97829 Non-pressure chronic ulcer of other part of left lower leg with unspecified severity: Secondary | ICD-10-CM | POA: Insufficient documentation

## 2023-11-12 DIAGNOSIS — Z79899 Other long term (current) drug therapy: Secondary | ICD-10-CM | POA: Diagnosis not present

## 2023-11-12 DIAGNOSIS — L97819 Non-pressure chronic ulcer of other part of right lower leg with unspecified severity: Secondary | ICD-10-CM | POA: Insufficient documentation

## 2023-11-12 DIAGNOSIS — L97919 Non-pressure chronic ulcer of unspecified part of right lower leg with unspecified severity: Secondary | ICD-10-CM | POA: Diagnosis not present

## 2023-11-12 DIAGNOSIS — I70299 Other atherosclerosis of native arteries of extremities, unspecified extremity: Secondary | ICD-10-CM

## 2023-11-12 DIAGNOSIS — Z87891 Personal history of nicotine dependence: Secondary | ICD-10-CM | POA: Insufficient documentation

## 2023-11-12 DIAGNOSIS — I70239 Atherosclerosis of native arteries of right leg with ulceration of unspecified site: Secondary | ICD-10-CM | POA: Diagnosis not present

## 2023-11-12 DIAGNOSIS — I70213 Atherosclerosis of native arteries of extremities with intermittent claudication, bilateral legs: Secondary | ICD-10-CM | POA: Diagnosis present

## 2023-11-12 DIAGNOSIS — Z7901 Long term (current) use of anticoagulants: Secondary | ICD-10-CM | POA: Insufficient documentation

## 2023-11-12 DIAGNOSIS — L97929 Non-pressure chronic ulcer of unspecified part of left lower leg with unspecified severity: Secondary | ICD-10-CM

## 2023-11-12 HISTORY — PX: LOWER EXTREMITY ANGIOGRAPHY: CATH118251

## 2023-11-12 LAB — CREATININE, SERUM
Creatinine, Ser: 1.1 mg/dL (ref 0.61–1.24)
GFR, Estimated: >60 mL/min (ref >60)

## 2023-11-12 LAB — BUN: BUN: 13 mg/dL (ref 8–23)

## 2023-11-12 SURGERY — LOWER EXTREMITY ANGIOGRAPHY
Anesthesia: Moderate Sedation | Site: Leg Lower | Laterality: Left

## 2023-11-12 MED ORDER — DIPHENHYDRAMINE HCL 50 MG/ML IJ SOLN: 50.0000 mg | Freq: Once | INTRAMUSCULAR | Status: DC | PRN

## 2023-11-12 MED ORDER — FAMOTIDINE 20 MG PO TABS: 40.0000 mg | ORAL_TABLET | Freq: Once | ORAL | Status: DC | PRN

## 2023-11-12 MED ORDER — ATORVASTATIN CALCIUM 20 MG PO TABS: 10.0000 mg | ORAL_TABLET | Freq: Every day | ORAL | Status: DC

## 2023-11-12 MED ORDER — MIDAZOLAM HCL 2 MG/ML PO SYRP: 8.0000 mg | ORAL_SOLUTION | Freq: Once | ORAL | Status: DC | PRN

## 2023-11-12 MED ORDER — METHYLPREDNISOLONE SODIUM SUCC 125 MG IJ SOLR: 125.0000 mg | Freq: Once | INTRAMUSCULAR | Status: DC | PRN

## 2023-11-12 MED ORDER — SODIUM CHLORIDE 0.9 % IV SOLN: INTRAVENOUS | Status: DC

## 2023-11-12 MED ORDER — IODIXANOL 320 MG/ML IV SOLN
INTRAVENOUS | Status: DC | PRN
  Administered 2023-11-12: 70 mL via INTRA_ARTERIAL

## 2023-11-12 MED ORDER — SODIUM CHLORIDE 0.9 % IV SOLN
INTRAVENOUS | Status: DC
Start: 2023-11-12 — End: 2023-11-12

## 2023-11-12 MED ORDER — ASPIRIN 81 MG PO TBEC: 81.0000 mg | DELAYED_RELEASE_TABLET | Freq: Every day | ORAL | Status: DC

## 2023-11-12 MED ORDER — HYDRALAZINE HCL 20 MG/ML IJ SOLN: 5.0000 mg | INTRAMUSCULAR | Status: DC | PRN

## 2023-11-12 MED ORDER — MIDAZOLAM HCL 2 MG/2ML IJ SOLN
INTRAMUSCULAR | Status: DC | PRN
Start: 2023-11-12 — End: 2023-11-12
  Administered 2023-11-12 (×2): 1 mg via INTRAVENOUS

## 2023-11-12 MED ORDER — CEFAZOLIN SODIUM-DEXTROSE 2-4 GM/100ML-% IV SOLN
2.0000 g | INTRAVENOUS | Status: AC
  Administered 2023-11-12: 2 g via INTRAVENOUS

## 2023-11-12 MED ORDER — LABETALOL HCL 5 MG/ML IV SOLN: 10.0000 mg | INTRAVENOUS | Status: DC | PRN

## 2023-11-12 MED ORDER — SODIUM CHLORIDE 0.9% FLUSH: 3.0000 mL | INTRAVENOUS | Status: DC | PRN

## 2023-11-12 MED ORDER — HEPARIN (PORCINE) IN NACL 1000-0.9 UT/500ML-% IV SOLN
INTRAVENOUS | Status: DC | PRN
  Administered 2023-11-12: 1000 mL

## 2023-11-12 MED ORDER — HYDROMORPHONE HCL 1 MG/ML IJ SOLN: 1.0000 mg | Freq: Once | INTRAMUSCULAR | Status: DC | PRN

## 2023-11-12 MED ORDER — LIDOCAINE-EPINEPHRINE (PF) 1 %-1:200000 IJ SOLN
INTRAMUSCULAR | Status: DC | PRN
  Administered 2023-11-12 (×2): 10 mL via INTRADERMAL

## 2023-11-12 MED ORDER — FENTANYL CITRATE PF 50 MCG/ML IJ SOSY
PREFILLED_SYRINGE | INTRAMUSCULAR | Status: AC
  Filled 2023-11-12: qty 1

## 2023-11-12 MED ORDER — CEFAZOLIN SODIUM-DEXTROSE 2-4 GM/100ML-% IV SOLN
INTRAVENOUS | Status: AC
  Filled 2023-11-12: qty 100

## 2023-11-12 MED ORDER — ACETAMINOPHEN 325 MG PO TABS: 650.0000 mg | ORAL_TABLET | ORAL | Status: DC | PRN

## 2023-11-12 MED ORDER — HEPARIN SODIUM (PORCINE) 1000 UNIT/ML IJ SOLN
INTRAMUSCULAR | Status: DC | PRN
  Administered 2023-11-12: 4000 [IU] via INTRAVENOUS

## 2023-11-12 MED ORDER — SODIUM CHLORIDE 0.9% FLUSH: 3.0000 mL | Freq: Two times a day (BID) | INTRAVENOUS | Status: DC

## 2023-11-12 MED ORDER — ATORVASTATIN CALCIUM 10 MG PO TABS
10.0000 mg | ORAL_TABLET | Freq: Every day | ORAL | 11 refills | Status: AC
Start: 2023-11-12 — End: 2024-11-11

## 2023-11-12 MED ORDER — HEPARIN SODIUM (PORCINE) 1000 UNIT/ML IJ SOLN
INTRAMUSCULAR | Status: AC
  Filled 2023-11-12: qty 10

## 2023-11-12 MED ORDER — FENTANYL CITRATE (PF) 100 MCG/2ML IJ SOLN
INTRAMUSCULAR | Status: DC | PRN
  Administered 2023-11-12: 25 ug via INTRAVENOUS
  Administered 2023-11-12: 50 ug via INTRAVENOUS
  Administered 2023-11-12: 25 ug via INTRAVENOUS

## 2023-11-12 MED ORDER — SODIUM CHLORIDE 0.9 % IV SOLN: 250.0000 mL | INTRAVENOUS | Status: DC | PRN

## 2023-11-12 MED ORDER — ASPIRIN 81 MG PO TBEC: 81.0000 mg | DELAYED_RELEASE_TABLET | Freq: Every day | ORAL | 2 refills | Status: AC

## 2023-11-12 MED ORDER — ONDANSETRON HCL 4 MG/2ML IJ SOLN: 4.0000 mg | Freq: Four times a day (QID) | INTRAMUSCULAR | Status: DC | PRN

## 2023-11-12 MED ORDER — MIDAZOLAM HCL 2 MG/2ML IJ SOLN
INTRAMUSCULAR | Status: AC
  Filled 2023-11-12: qty 2

## 2023-11-12 SURGICAL SUPPLY — 15 items
BALLOON LUTONIX DCB 6X60X130 (BALLOONS) IMPLANT
CATH PIG 70CM (CATHETERS) IMPLANT
COVER PROBE ULTRASOUND 5X96 (MISCELLANEOUS) IMPLANT
DEVICE PRESTO INFLATION (MISCELLANEOUS) IMPLANT
DEVICE STARCLOSE SE CLOSURE (Vascular Products) IMPLANT
GLIDESHEATH SLENDER 7FR .021G (SHEATH) IMPLANT
GLIDEWIRE ADV .035X260CM (WIRE) IMPLANT
PACK ANGIOGRAPHY (CUSTOM PROCEDURE TRAY) ×1 IMPLANT
SHEATH BRITE TIP 5FRX11 (SHEATH) IMPLANT
SHEATH BRITE TIP 6FRX11 (SHEATH) IMPLANT
STENT LIFESTREAM 8X58X80 (Permanent Stent) IMPLANT
SYR MEDRAD MARK 7 150ML (SYRINGE) IMPLANT
TUBING CONTRAST HIGH PRESS 72 (TUBING) IMPLANT
WIRE J 3MM .035X145CM (WIRE) IMPLANT
WIRE SUPRACORE 300CM (WIRE) IMPLANT

## 2023-11-12 NOTE — Interval H&P Note (Signed)
 History and Physical Interval Note:  11/12/2023 8:12 AM  Cory Moses  has presented today for surgery, with the diagnosis of LLE Angio   ASO w ulceration [facility].  The various methods of treatment have been discussed with the patient and family. After consideration of risks, benefits and other options for treatment, the patient has consented to  Procedure(s): Lower Extremity Angiography (Left) as a surgical intervention.  The patient's history has been reviewed, patient examined, no change in status, stable for surgery.  I have reviewed the patient's chart and labs.  Questions were answered to the patient's satisfaction.     [name]

## 2023-11-12 NOTE — Op Note (Signed)
 [facility]  Percutaneous Study/Intervention Procedural Note   Date of Surgery: 11/12/2023  Surgeon(s):Fidencia Mccloud    Assistants:none  Pre-operative Diagnosis: PAD with ulceration bilateral lower extremities  Post-operative diagnosis:  Same  Procedure(s) Performed:             1.  Ultrasound guidance for vascular access bilateral femoral arteries             2.  Catheter placement into left common femoral artery from right femoral approach and into the aorta from left femoral approach             3.  Aortogram and selective bilateral lower extremity angiograms             4.  Percutaneous transluminal angioplasty of left common femoral artery and distal external iliac artery with 6 mm diameter by 6 cm length Lutonix drug-coated angioplasty balloon             5.  Kissing balloon expandable stent placements to bilateral common iliac arteries with 8 mm diameter by 58 mm length Lifestream stents  6.  StarClose closure device bilaterally femoral arteries  EBL: 10 cc  Contrast: 70 cc  Fluoro Time: 6 minutes  Moderate Conscious Sedation Time: approximately 45 minutes using 2 mg of Versed and 100 mcg of Fentanyl              Indications:  Patient is a 63 y.o.male with nonhealing ulcerations of both lower extremities. The patient has noninvasive study showing left common femoral artery stenosis by duplex. The patient is brought in for angiography for further evaluation and potential treatment.  Due to the limb threatening nature of the situation, angiogram was performed for attempted limb salvage. The patient is aware that if the procedure fails, amputation would be expected.  The patient also understands that even with successful revascularization, amputation may still be required due to the severity of the situation.  Risks and benefits are discussed and informed consent is obtained.   Procedure:  The patient was identified and appropriate procedural time out was  performed.  The patient was then placed supine on the table and prepped and draped in the usual sterile fashion. Moderate conscious sedation was administered during a face to face encounter with the patient throughout the procedure with my supervision of the RN administering medicines and monitoring the patient's vital signs, pulse oximetry, telemetry and mental status throughout from the start of the procedure until the patient was taken to the recovery room. Ultrasound was used to evaluate the right common femoral artery.  It was patent .  A digital ultrasound image was acquired.  A Seldinger needle was used to access the right common femoral artery under direct ultrasound guidance and a permanent image was performed.  A 0.035 J wire was advanced without resistance and a 5Fr sheath was placed.  Pigtail catheter was placed into the aorta and an AP aortogram was performed.  The pigtail catheter was then pulled down to the aortic bifurcation and pelvic obliques were performed.  This demonstrated normal renal arteries and normal aorta.  The right common iliac artery had a fairly high-grade stenosis in the 75 to 80% range.  This was in the mid right common iliac artery.  The left common iliac artery 70% stenosis proximally.  Both internal iliac arteries were patent.  The right external iliac artery was patent.  The left external iliac artery was fairly normal down to its most distal aspect where there  was disease that continued down into the proximal to mid left common femoral artery and the 70% range. I then crossed the aortic bifurcation and advanced to the left femoral head. Selective left lower extremity angiogram was then performed. This demonstrated the above-mentioned stenosis in the proximal to mid left common femoral artery.  The distal left common femoral artery and femoral bifurcation were patent.  The profunda femoris artery was patent.  The SFA and popliteal arteries had no significant stenosis.  There was  then two-vessel runoff with the anterior tibial and posterior tibial arteries distally although they were small and the flow to the foot was somewhat sluggish likely due to the inflow disease. It was felt that it was in the patient's best interest to proceed with intervention after these images to avoid a second procedure and a larger amount of contrast and fluoroscopy based off of the findings from the initial angiogram.  This would require treatment of the left common femoral artery lesion as well as kissing stent placement in both common iliac arteries.  As such, access was selected at the most distal left common femoral artery just above the bifurcation to allow treatment of the left common femoral artery and distal external iliac artery.  Ultrasound was used to visualize the femoral bifurcation and we accessed the left side under direct ultrasound guidance with a Seldinger needle.  A 0.018 wire was placed and the 7 French slender sheath was placed over this wire.  A permanent image was recorded on the left as well.  A supra core wire was then used to cross the stenosis in the common iliac artery and parked the wire into the aorta.  The pigtail catheter then pulled back into the aorta and an advantage wire was placed up the right side.  I then selected 8 mm diameter by 58 mm length Lifestream stents for both common iliac arteries and deployed the simultaneously into the distal aorta and encompassing the lesions in both common iliac arteries.  These were inflated to 12 atm.  Completion imaging showed no greater than 10% residual stenosis in either common iliac artery.  On the left side, the sheath was then pulled down and a 6 mm diameter by 6 cm length Lutonix drug-coated angioplasty balloon was inflated to 8 atm for 1 minute.  Completion imaging showed significant improvement with only about a 20% residual stenosis in the left common femoral and most distal external iliac artery.  I elected to go ahead and image  the right lower extremity through right femoral sheath while we were there.  This demonstrated no significant stenosis in the right common femoral artery, superficial femoral artery, or popliteal artery.  There was then two-vessel runoff with an anterior tibial and the peroneal artery distally.  I elected to terminate the procedure. The sheath was removed and StarClose closure device was deployed in the left femoral artery with excellent hemostatic result.  Similarly, StarClose closure device was deployed in the right femoral artery with excellent hemostatic result.  The patient was taken to the recovery room in stable condition having tolerated the procedure well.  Findings:               Aortogram:  This demonstrated normal renal arteries and normal aorta.  The right common iliac artery had a fairly high-grade stenosis in the 75 to 80% range.  This was in the mid right common iliac artery.  The left common iliac artery 70% stenosis proximally.  Both internal iliac arteries  were patent.  The right external iliac artery was patent.  The left external iliac artery was fairly normal down to its most distal aspect where there was disease that continued down into the proximal to mid left common femoral artery and the 70% range.             Left Lower Extremity:  This demonstrated the above-mentioned stenosis in the proximal to mid left common femoral artery.  The distal left common femoral artery and femoral bifurcation were patent.  The profunda femoris artery was patent.  The SFA and popliteal arteries had no significant stenosis.  There was then two-vessel runoff with the anterior tibial and posterior tibial arteries distally although they were small and the flow to the foot was somewhat sluggish likely due to the inflow disease.   Right Lower Extremity: This demonstrated no significant stenosis in the right common femoral artery, superficial femoral artery, or popliteal artery.  There was then two-vessel runoff  with an anterior tibial and the peroneal artery distally.    Disposition: Patient was taken to the recovery room in stable condition having tolerated the procedure well.  Complications: None  Mikki Alexander 11/12/2023 9:58 AM   This note was created with Dragon Medical transcription system. Any errors in dictation are purely unintentional.

## 2023-11-15 ENCOUNTER — Telehealth (INDEPENDENT_AMBULATORY_CARE_PROVIDER_SITE_OTHER): Payer: Self-pay | Admitting: Nurse Practitioner

## 2023-11-15 NOTE — Telephone Encounter (Signed)
 Michelle from [facility] LVM for [facility] inquiring about patient appt. Returned call but was unable to LVM

## 2023-11-16 ENCOUNTER — Encounter: Payer: Self-pay | Admitting: Intensive Care

## 2023-11-16 ENCOUNTER — Emergency Department: Payer: Medicare (Managed Care)

## 2023-11-16 ENCOUNTER — Emergency Department
Admission: EM | Admit: 2023-11-16 | Discharge: 2023-11-16 | Disposition: A | Discharge status: Home / Self Care | Payer: Medicare (Managed Care) | Attending: Emergency Medicine | Admitting: Emergency Medicine

## 2023-11-16 ENCOUNTER — Other Ambulatory Visit: Payer: Self-pay

## 2023-11-16 DIAGNOSIS — T7840XA Allergy, unspecified, initial encounter: Secondary | ICD-10-CM | POA: Insufficient documentation

## 2023-11-16 DIAGNOSIS — J449 Chronic obstructive pulmonary disease, unspecified: Secondary | ICD-10-CM | POA: Insufficient documentation

## 2023-11-16 DIAGNOSIS — R509 Fever, unspecified: Secondary | ICD-10-CM | POA: Diagnosis present

## 2023-11-16 LAB — CBC WITH DIFFERENTIAL/PLATELET
Abs Immature Granulocytes: 0.03 10*3/uL (ref 0.00–0.07)
Basophils Absolute: 0 10*3/uL (ref 0.0–0.1)
Basophils Relative: 0 %
Eosinophils Absolute: 0.9 10*3/uL — ABNORMAL HIGH (ref 0.0–0.5)
Eosinophils Relative: 8 %
HCT: 37.3 % — ABNORMAL LOW (ref 39.0–52.0)
Hemoglobin: 11.5 g/dL — ABNORMAL LOW (ref 13.0–17.0)
Immature Granulocytes: 0 %
Lymphocytes Relative: 24 %
Lymphs Abs: 2.6 10*3/uL (ref 0.7–4.0)
MCH: 27.1 pg (ref 26.0–34.0)
MCHC: 30.8 g/dL (ref 30.0–36.0)
MCV: 87.8 fL (ref 80.0–100.0)
Monocytes Absolute: 0.7 10*3/uL (ref 0.1–1.0)
Monocytes Relative: 6 %
Neutro Abs: 6.6 10*3/uL (ref 1.7–7.7)
Neutrophils Relative %: 62 %
Platelets: 344 10*3/uL (ref 150–400)
RBC: 4.25 MIL/uL (ref 4.22–5.81)
RDW: 14.1 % (ref 11.5–15.5)
WBC: 10.9 10*3/uL — ABNORMAL HIGH (ref 4.0–10.5)
nRBC: 0 % (ref 0.0–0.2)

## 2023-11-16 LAB — COMPREHENSIVE METABOLIC PANEL WITH GFR
ALT: 10 U/L (ref 0–44)
AST: 16 U/L (ref 15–41)
Albumin: 2.8 g/dL — ABNORMAL LOW (ref 3.5–5.0)
Alkaline Phosphatase: 78 U/L (ref 38–126)
Anion gap: 10 (ref 5–15)
BUN: 18 mg/dL (ref 8–23)
CO2: 28 mmol/L (ref 22–32)
Calcium: 8.5 mg/dL — ABNORMAL LOW (ref 8.9–10.3)
Chloride: 97 mmol/L — ABNORMAL LOW (ref 98–111)
Creatinine, Ser: 1.06 mg/dL (ref 0.61–1.24)
GFR, Estimated: >60 mL/min (ref >60)
Glucose, Bld: 98 mg/dL (ref 70–99)
Potassium: 4.5 mmol/L (ref 3.5–5.1)
Sodium: 135 mmol/L (ref 135–145)
Total Bilirubin: 0.5 mg/dL (ref 0.0–1.2)
Total Protein: 6.9 g/dL (ref 6.5–8.1)

## 2023-11-16 LAB — LACTIC ACID, PLASMA: Lactic Acid, Venous: 1.6 mmol/L (ref 0.5–1.9)

## 2023-11-16 MED ORDER — PREDNISONE 20 MG PO TABS
60.0000 mg | ORAL_TABLET | Freq: Once | ORAL | Status: AC
  Administered 2023-11-16: 60 mg via ORAL
  Filled 2023-11-16: qty 3

## 2023-11-16 MED ORDER — PREDNISONE 10 MG (21) PO TBPK: ORAL_TABLET | ORAL | 0 refills | Status: DC

## 2023-11-16 NOTE — ED Provider Notes (Signed)
 [facility] Provider Note   Event Date/Time   First MD Initiated Contact with Patient 11/16/23 1503     (approximate) History  Fever and Leg Swelling  HPI Cory Moses is a 62 y.o. male with a past medical history of epilepsy, COPD, vascular dementia who presents after a vascular procedure to the left lower extremity 5 days prior to arrival has been suffering hives over bilateral lower extremities and abdomen that been worsening since this procedure.  Patient was told that he cannot have Benadryl given how much opiate medications he is using.  Patient's wife at bedside states that she has been using Benadryl cream but is concerned that patient may be allergic to his stent.  Caregiver also concerned that patient usually uses 2 L nasal cannula at night and has been needing to use it at all times since the procedure. ROS: Patient currently denies any vision changes, tinnitus, difficulty speaking, facial droop, sore throat, chest pain, shortness of breath, abdominal pain, nausea/vomiting/diarrhea, dysuria, or weakness/numbness/paresthesias in any extremity   Physical Exam  Triage Vital Signs: ED Triage Vitals  Encounter Vitals Group     BP 11/16/23 1259 117/63     Systolic BP Percentile --      Diastolic BP Percentile --      Pulse Rate 11/16/23 1259 88     Resp 11/16/23 1259 20     Temp 11/16/23 1259 98.9 F (37.2 C)     Temp Source 11/16/23 1259 Oral     SpO2 11/16/23 1259 92 %     Weight 11/16/23 1300 148 lb (67.1 kg)     Height 11/16/23 1300 6\' 1"  (1.854 m)     Head Circumference --      Peak Flow --      Pain Score 11/16/23 1300 8     Pain Loc --      Pain Education --      Exclude from Growth Chart --    Most recent vital signs: Vitals:   11/16/23 1259 11/16/23 1500  BP: 117/63 (!) 145/64  Pulse: 88 (!) 101  Resp: 20 10  Temp: 98.9 F (37.2 C)   SpO2: 92% 92%   General: Awake, oriented x4. CV:  Good peripheral perfusion.   Resp:  Normal effort.  Abd:  No distention.  Other:  Erythema and induration over trunk and bilateral lower extremities to the knee.  Elderly well-developed, well-nourished Caucasian male resting comfortably in no acute distress ED Results / Procedures / Treatments  Labs (all labs ordered are listed, but only abnormal results are displayed) Labs Reviewed  COMPREHENSIVE METABOLIC PANEL WITH GFR - Abnormal; Notable for the following components:      Result Value   Chloride 97 (*)    Calcium 8.5 (*)    Albumin 2.8 (*)    All other components within normal limits  CBC WITH DIFFERENTIAL/PLATELET - Abnormal; Notable for the following components:   WBC 10.9 (*)    Hemoglobin 11.5 (*)    HCT 37.3 (*)    Eosinophils Absolute 0.9 (*)    All other components within normal limits  LACTIC ACID, PLASMA  LACTIC ACID, PLASMA  URINALYSIS, W/ REFLEX TO CULTURE (INFECTION SUSPECTED)   EKG ED ECG REPORT I, Charleen Conn, the attending physician, personally viewed and interpreted this ECG. Date: 11/16/2023 EKG Time: 1309 Rate: 81 Rhythm: normal sinus rhythm QRS Axis: normal Intervals: normal ST/T Wave abnormalities: normal Narrative Interpretation: no evidence of acute ischemia  RADIOLOGY ED MD interpretation: One-view portable chest x-ray interpreted by me shows no evidence of acute abnormalities including no pneumonia, pneumothorax, or widened mediastinum -Agree with radiology assessment Official radiology report(s): DG Chest Port 1 View Result Date: 11/16/2023 CLINICAL DATA:  Cough. EXAM: PORTABLE CHEST 1 VIEW COMPARISON:  Chest radiograph dated 02/18/2023. FINDINGS: Diffuse chronic coarsening. No focal consolidation, pleural effusion or pneumothorax. The cardiac silhouette is within normal limits. No acute osseous pathology. IMPRESSION: No active disease. Electronically Signed   By: Angus Bark M.D.   On: 11/16/2023 13:56   PROCEDURES: Critical Care performed: No .1-3 Lead EKG  Interpretation  Performed by: Charleen Conn, MD Authorized by: Charleen Conn, MD     Interpretation: normal     ECG rate:  91   ECG rate assessment: normal     Rhythm: sinus rhythm     Ectopy: none     Conduction: normal    MEDICATIONS ORDERED IN ED: Medications  predniSONE (DELTASONE) tablet 60 mg (has no administration in time range)   IMPRESSION / MDM / ASSESSMENT AND PLAN / ED COURSE  I reviewed the triage vital signs and the nursing notes.                             The patient is on the cardiac monitor to evaluate for evidence of arrhythmia and/or significant heart rate changes. Patient's presentation is most consistent with acute presentation with potential threat to life or bodily function. + Cutaneous hives/erythema No evidence of multiorgan involvement  Given history and exam, presentation most consistent with allergic reaction. I have low suspicion for toxic shock syndrome, anaphylaxis, asthma exacerbation, or drug toxicity. Rx: Prednisone 60mg  qday x3days, Benadryl cream as needed Disposition: Discharge home with SRP. Follow up with PCP in 1-2 days.   FINAL CLINICAL IMPRESSION(S) / ED DIAGNOSES   Final diagnoses:  Allergic reaction, initial encounter   Rx / DC Orders   ED Discharge Orders          Ordered    predniSONE (STERAPRED UNI-PAK 21 TAB) 10 MG (21) TBPK tablet        11/16/23 1533           Note:  This document was prepared using Dragon voice recognition software and may include unintentional dictation errors.   Holly Iannaccone K, MD 11/16/23 1537

## 2023-11-16 NOTE — ED Notes (Signed)
 [facility] called for transport to residence spoke with Myrtie Atkinson

## 2023-11-16 NOTE — ED Triage Notes (Signed)
 Wife reports patient has been having trouble swallowing liquid and food for the last few weeks. Home nurse is concerned he has pneumonia from aspiration. Wife reports low grade fevers and trouble urinating  Patient had groin stent placements on Monday. Wife concerned about bilateral foot swelling. Wife is concerned patient is allergic to possible hardware used for stent.  Patient is bedbound since March 2024  History factor five blood clotting disease  A&O x4 in triage at this time  Patient normally wears 2L O2 at night but since his stent surgery he has been wearing 2L continuously the past week

## 2023-11-19 ENCOUNTER — Ambulatory Visit: Admit: 2023-11-19 | Payer: Medicare (Managed Care) | Admitting: Vascular Surgery

## 2023-11-19 DIAGNOSIS — I70299 Other atherosclerosis of native arteries of extremities, unspecified extremity: Secondary | ICD-10-CM

## 2023-11-19 SURGERY — LOWER EXTREMITY ANGIOGRAPHY
Anesthesia: Moderate Sedation | Site: Leg Lower | Laterality: Right

## 2023-11-20 ENCOUNTER — Encounter (INDEPENDENT_AMBULATORY_CARE_PROVIDER_SITE_OTHER): Payer: Self-pay

## 2023-11-21 ENCOUNTER — Encounter: Payer: Self-pay | Admitting: Vascular Surgery

## 2023-12-03 ENCOUNTER — Other Ambulatory Visit: Payer: Self-pay

## 2023-12-03 ENCOUNTER — Encounter: Payer: Self-pay | Admitting: Internal Medicine

## 2023-12-03 ENCOUNTER — Emergency Department: Payer: Medicare (Managed Care)

## 2023-12-03 ENCOUNTER — Inpatient Hospital Stay
Admission: EM | Admit: 2023-12-03 | Discharge: 2023-12-07 | DRG: 871 | Disposition: A | Discharge status: Hospice | Payer: Medicare (Managed Care) | Attending: Internal Medicine | Admitting: Internal Medicine

## 2023-12-03 DIAGNOSIS — M549 Dorsalgia, unspecified: Secondary | ICD-10-CM | POA: Diagnosis present

## 2023-12-03 DIAGNOSIS — R0602 Shortness of breath: Secondary | ICD-10-CM | POA: Diagnosis present

## 2023-12-03 DIAGNOSIS — Z79891 Long term (current) use of opiate analgesic: Secondary | ICD-10-CM

## 2023-12-03 DIAGNOSIS — L97929 Non-pressure chronic ulcer of unspecified part of left lower leg with unspecified severity: Secondary | ICD-10-CM | POA: Diagnosis present

## 2023-12-03 DIAGNOSIS — I739 Peripheral vascular disease, unspecified: Secondary | ICD-10-CM

## 2023-12-03 DIAGNOSIS — Z66 Do not resuscitate: Secondary | ICD-10-CM | POA: Diagnosis present

## 2023-12-03 DIAGNOSIS — Z8249 Family history of ischemic heart disease and other diseases of the circulatory system: Secondary | ICD-10-CM

## 2023-12-03 DIAGNOSIS — F0153 Vascular dementia, unspecified severity, with mood disturbance: Secondary | ICD-10-CM | POA: Diagnosis present

## 2023-12-03 DIAGNOSIS — J439 Emphysema, unspecified: Secondary | ICD-10-CM | POA: Diagnosis not present

## 2023-12-03 DIAGNOSIS — R9431 Abnormal electrocardiogram [ECG] [EKG]: Secondary | ICD-10-CM | POA: Diagnosis not present

## 2023-12-03 DIAGNOSIS — Z1152 Encounter for screening for COVID-19: Secondary | ICD-10-CM

## 2023-12-03 DIAGNOSIS — A419 Sepsis, unspecified organism: Secondary | ICD-10-CM | POA: Diagnosis present

## 2023-12-03 DIAGNOSIS — J9621 Acute and chronic respiratory failure with hypoxia: Secondary | ICD-10-CM | POA: Diagnosis present

## 2023-12-03 DIAGNOSIS — Z888 Allergy status to other drugs, medicaments and biological substances status: Secondary | ICD-10-CM

## 2023-12-03 DIAGNOSIS — E872 Acidosis, unspecified: Secondary | ICD-10-CM | POA: Diagnosis present

## 2023-12-03 DIAGNOSIS — R1310 Dysphagia, unspecified: Secondary | ICD-10-CM | POA: Diagnosis present

## 2023-12-03 DIAGNOSIS — J690 Pneumonitis due to inhalation of food and vomit: Secondary | ICD-10-CM | POA: Diagnosis present

## 2023-12-03 DIAGNOSIS — F418 Other specified anxiety disorders: Secondary | ICD-10-CM | POA: Diagnosis not present

## 2023-12-03 DIAGNOSIS — R6520 Severe sepsis without septic shock: Secondary | ICD-10-CM | POA: Diagnosis present

## 2023-12-03 DIAGNOSIS — N401 Enlarged prostate with lower urinary tract symptoms: Secondary | ICD-10-CM | POA: Diagnosis not present

## 2023-12-03 DIAGNOSIS — Z7982 Long term (current) use of aspirin: Secondary | ICD-10-CM | POA: Diagnosis not present

## 2023-12-03 DIAGNOSIS — L97919 Non-pressure chronic ulcer of unspecified part of right lower leg with unspecified severity: Secondary | ICD-10-CM | POA: Diagnosis present

## 2023-12-03 DIAGNOSIS — Z87891 Personal history of nicotine dependence: Secondary | ICD-10-CM

## 2023-12-03 DIAGNOSIS — Z7401 Bed confinement status: Secondary | ICD-10-CM

## 2023-12-03 DIAGNOSIS — J9601 Acute respiratory failure with hypoxia: Principal | ICD-10-CM | POA: Diagnosis present

## 2023-12-03 DIAGNOSIS — Z7901 Long term (current) use of anticoagulants: Secondary | ICD-10-CM | POA: Diagnosis not present

## 2023-12-03 DIAGNOSIS — G894 Chronic pain syndrome: Secondary | ICD-10-CM | POA: Diagnosis present

## 2023-12-03 DIAGNOSIS — T403X5A Adverse effect of methadone, initial encounter: Secondary | ICD-10-CM | POA: Diagnosis present

## 2023-12-03 DIAGNOSIS — E785 Hyperlipidemia, unspecified: Secondary | ICD-10-CM

## 2023-12-03 DIAGNOSIS — I4581 Long QT syndrome: Secondary | ICD-10-CM | POA: Diagnosis present

## 2023-12-03 DIAGNOSIS — I10 Essential (primary) hypertension: Secondary | ICD-10-CM | POA: Diagnosis present

## 2023-12-03 DIAGNOSIS — Z7189 Other specified counseling: Secondary | ICD-10-CM | POA: Diagnosis not present

## 2023-12-03 DIAGNOSIS — J449 Chronic obstructive pulmonary disease, unspecified: Secondary | ICD-10-CM | POA: Diagnosis present

## 2023-12-03 DIAGNOSIS — F0154 Vascular dementia, unspecified severity, with anxiety: Secondary | ICD-10-CM | POA: Diagnosis present

## 2023-12-03 DIAGNOSIS — K219 Gastro-esophageal reflux disease without esophagitis: Secondary | ICD-10-CM | POA: Diagnosis present

## 2023-12-03 DIAGNOSIS — N400 Enlarged prostate without lower urinary tract symptoms: Secondary | ICD-10-CM | POA: Diagnosis present

## 2023-12-03 DIAGNOSIS — R5381 Other malaise: Secondary | ICD-10-CM | POA: Diagnosis present

## 2023-12-03 DIAGNOSIS — Z7951 Long term (current) use of inhaled steroids: Secondary | ICD-10-CM

## 2023-12-03 DIAGNOSIS — Z885 Allergy status to narcotic agent status: Secondary | ICD-10-CM

## 2023-12-03 DIAGNOSIS — Z515 Encounter for palliative care: Secondary | ICD-10-CM

## 2023-12-03 DIAGNOSIS — J189 Pneumonia, unspecified organism: Secondary | ICD-10-CM | POA: Diagnosis not present

## 2023-12-03 DIAGNOSIS — G629 Polyneuropathy, unspecified: Secondary | ICD-10-CM | POA: Diagnosis present

## 2023-12-03 DIAGNOSIS — D6851 Activated protein C resistance: Secondary | ICD-10-CM | POA: Diagnosis present

## 2023-12-03 DIAGNOSIS — Z9981 Dependence on supplemental oxygen: Secondary | ICD-10-CM | POA: Diagnosis not present

## 2023-12-03 DIAGNOSIS — Z79899 Other long term (current) drug therapy: Secondary | ICD-10-CM

## 2023-12-03 DIAGNOSIS — Z823 Family history of stroke: Secondary | ICD-10-CM

## 2023-12-03 DIAGNOSIS — K5903 Drug induced constipation: Secondary | ICD-10-CM | POA: Diagnosis present

## 2023-12-03 HISTORY — DX: Peripheral vascular disease, unspecified: I73.9

## 2023-12-03 HISTORY — DX: Hyperlipidemia, unspecified: E78.5

## 2023-12-03 HISTORY — DX: Essential (primary) hypertension: I10

## 2023-12-03 HISTORY — DX: Activated protein C resistance: D68.51

## 2023-12-03 HISTORY — DX: Other specified anxiety disorders: F41.8

## 2023-12-03 LAB — COMPREHENSIVE METABOLIC PANEL WITH GFR
ALT: 8 U/L (ref 0–44)
AST: 19 U/L (ref 15–41)
Albumin: 2.9 g/dL — ABNORMAL LOW (ref 3.5–5.0)
Alkaline Phosphatase: 58 U/L (ref 38–126)
Anion gap: 14 (ref 5–15)
BUN: 25 mg/dL — ABNORMAL HIGH (ref 8–23)
CO2: 24 mmol/L (ref 22–32)
Calcium: 8.3 mg/dL — ABNORMAL LOW (ref 8.9–10.3)
Chloride: 102 mmol/L (ref 98–111)
Creatinine, Ser: 1.27 mg/dL — ABNORMAL HIGH (ref 0.61–1.24)
GFR, Estimated: >60 mL/min (ref >60)
Glucose, Bld: 139 mg/dL — ABNORMAL HIGH (ref 70–99)
Potassium: 3.9 mmol/L (ref 3.5–5.1)
Sodium: 140 mmol/L (ref 135–145)
Total Bilirubin: 0.7 mg/dL (ref 0.0–1.2)
Total Protein: 6.9 g/dL (ref 6.5–8.1)

## 2023-12-03 LAB — BLOOD GAS, VENOUS
Acid-Base Excess: 3.3 mmol/L — ABNORMAL HIGH (ref 0.0–2.0)
Bicarbonate: 29.1 mmol/L — ABNORMAL HIGH (ref 20.0–28.0)
O2 Saturation: 78.4 %
Patient temperature: 37
pCO2, Ven: 48 mmHg (ref 44–60)
pH, Ven: 7.39 (ref 7.25–7.43)
pO2, Ven: 45 mmHg (ref 32–45)

## 2023-12-03 LAB — CBC WITH DIFFERENTIAL/PLATELET
Abs Immature Granulocytes: 0.12 10*3/uL — ABNORMAL HIGH (ref 0.00–0.07)
Basophils Absolute: 0.2 10*3/uL — ABNORMAL HIGH (ref 0.0–0.1)
Basophils Relative: 1 %
Eosinophils Absolute: 0.2 10*3/uL (ref 0.0–0.5)
Eosinophils Relative: 1 %
HCT: 32.8 % — ABNORMAL LOW (ref 39.0–52.0)
Hemoglobin: 10.3 g/dL — ABNORMAL LOW (ref 13.0–17.0)
Immature Granulocytes: 1 %
Lymphocytes Relative: 20 %
Lymphs Abs: 4.1 10*3/uL — ABNORMAL HIGH (ref 0.7–4.0)
MCH: 27.8 pg (ref 26.0–34.0)
MCHC: 31.4 g/dL (ref 30.0–36.0)
MCV: 88.4 fL (ref 80.0–100.0)
Monocytes Absolute: 1.1 10*3/uL — ABNORMAL HIGH (ref 0.1–1.0)
Monocytes Relative: 6 %
Neutro Abs: 14.7 10*3/uL — ABNORMAL HIGH (ref 1.7–7.7)
Neutrophils Relative %: 71 %
Platelets: 340 10*3/uL (ref 150–400)
RBC: 3.71 MIL/uL — ABNORMAL LOW (ref 4.22–5.81)
RDW: 14.7 % (ref 11.5–15.5)
WBC: 20.4 10*3/uL — ABNORMAL HIGH (ref 4.0–10.5)
nRBC: 0 % (ref 0.0–0.2)

## 2023-12-03 LAB — RESP PANEL BY RT-PCR (RSV, FLU A&B, COVID)  RVPGX2
Influenza A by PCR: NEGATIVE
Influenza B by PCR: NEGATIVE
Resp Syncytial Virus by PCR: NEGATIVE
SARS Coronavirus 2 by RT PCR: NEGATIVE

## 2023-12-03 LAB — PROTIME-INR
INR: 1.8 — ABNORMAL HIGH (ref 0.8–1.2)
Prothrombin Time: 21.2 s — ABNORMAL HIGH (ref 11.4–15.2)

## 2023-12-03 LAB — LACTIC ACID, PLASMA
Lactic Acid, Venous: 2 mmol/L (ref 0.5–1.9)
Lactic Acid, Venous: 3 mmol/L (ref 0.5–1.9)
Lactic Acid, Venous: 3.6 mmol/L (ref 0.5–1.9)

## 2023-12-03 LAB — MAGNESIUM: Magnesium: 2.1 mg/dL (ref 1.7–2.4)

## 2023-12-03 LAB — SAMPLE TO BLOOD BANK

## 2023-12-03 MED ORDER — ALBUTEROL SULFATE (2.5 MG/3ML) 0.083% IN NEBU
2.5000 mg | INHALATION_SOLUTION | RESPIRATORY_TRACT | Status: DC | PRN
  Administered 2023-12-05 – 2023-12-06 (×4): 2.5 mg via RESPIRATORY_TRACT
  Filled 2023-12-03 (×4): qty 3

## 2023-12-03 MED ORDER — HYDRALAZINE HCL 20 MG/ML IJ SOLN: 5.0000 mg | INTRAMUSCULAR | Status: DC | PRN

## 2023-12-03 MED ORDER — SODIUM CHLORIDE 0.9 % IV SOLN
2.0000 g | Freq: Once | INTRAVENOUS | Status: AC
  Administered 2023-12-03: 2 g via INTRAVENOUS
  Filled 2023-12-03: qty 20

## 2023-12-03 MED ORDER — METHYLPREDNISOLONE SODIUM SUCC 125 MG IJ SOLR
125.0000 mg | Freq: Once | INTRAMUSCULAR | Status: AC
  Administered 2023-12-03: 125 mg via INTRAVENOUS
  Filled 2023-12-03: qty 2

## 2023-12-03 MED ORDER — IPRATROPIUM-ALBUTEROL 0.5-2.5 (3) MG/3ML IN SOLN
3.0000 mL | RESPIRATORY_TRACT | Status: DC
  Administered 2023-12-03 (×2): 3 mL via RESPIRATORY_TRACT
  Filled 2023-12-03 (×2): qty 3

## 2023-12-03 MED ORDER — SODIUM CHLORIDE 0.9 % IV SOLN
100.0000 mg | Freq: Two times a day (BID) | INTRAVENOUS | Status: DC
  Administered 2023-12-03 – 2023-12-04 (×2): 100 mg via INTRAVENOUS
  Filled 2023-12-03 (×5): qty 100

## 2023-12-03 MED ORDER — SODIUM CHLORIDE 0.9 % IV BOLUS
1000.0000 mL | Freq: Once | INTRAVENOUS | Status: AC
  Administered 2023-12-03: 1000 mL via INTRAVENOUS

## 2023-12-03 MED ORDER — METHYLPREDNISOLONE SODIUM SUCC 125 MG IJ SOLR: 80.0000 mg | Freq: Every day | INTRAMUSCULAR | Status: DC

## 2023-12-03 MED ORDER — LACTATED RINGERS IV BOLUS (SEPSIS): 1000.0000 mL | Freq: Once | INTRAVENOUS | Status: DC

## 2023-12-03 MED ORDER — ACETAMINOPHEN 325 MG PO TABS: 650.0000 mg | ORAL_TABLET | Freq: Four times a day (QID) | ORAL | Status: DC | PRN

## 2023-12-03 MED ORDER — DM-GUAIFENESIN ER 30-600 MG PO TB12: 1.0000 | ORAL_TABLET | Freq: Two times a day (BID) | ORAL | Status: DC | PRN

## 2023-12-03 MED ORDER — SODIUM CHLORIDE 0.9 % IV SOLN: INTRAVENOUS | Status: DC

## 2023-12-03 MED ORDER — SODIUM CHLORIDE 0.9 % IV SOLN
500.0000 mg | Freq: Once | INTRAVENOUS | Status: AC
  Administered 2023-12-03: 500 mg via INTRAVENOUS
  Filled 2023-12-03: qty 5

## 2023-12-03 MED ORDER — IPRATROPIUM-ALBUTEROL 0.5-2.5 (3) MG/3ML IN SOLN
3.0000 mL | Freq: Once | RESPIRATORY_TRACT | Status: AC
  Administered 2023-12-03: 3 mL via RESPIRATORY_TRACT
  Filled 2023-12-03: qty 3

## 2023-12-03 MED ORDER — DIPHENHYDRAMINE HCL 50 MG/ML IJ SOLN: 12.5000 mg | Freq: Three times a day (TID) | INTRAMUSCULAR | Status: DC | PRN

## 2023-12-03 MED ORDER — SODIUM CHLORIDE 0.9 % IV SOLN
2.0000 g | INTRAVENOUS | Status: DC
  Filled 2023-12-03: qty 20

## 2023-12-03 NOTE — Progress Notes (Signed)
 Patient transported from ER to floor on BIPAP.  Patient tolerated well.  BIPAP 12/6 60%.  Patient currently still has dentures in and refused removal of them. RN and family aware at bedside.

## 2023-12-03 NOTE — ED Triage Notes (Signed)
 Pt arrives via [facility] from home for SOB. Pt arrives on CPAP, still hypoxic. Pt received albuterol at home.

## 2023-12-03 NOTE — Sepsis Progress Note (Signed)
 Sepsis protocol is being followed by [facility].

## 2023-12-03 NOTE — ED Provider Notes (Signed)
 [facility] Provider Note    Event Date/Time   First MD Initiated Contact with Patient 12/03/23 1642     (approximate)   History   Shortness of Breath   HPI Cory Moses is a 63 y.o. male with history of vascular dementia, factor V Leiden chronically on Eliquis , COPD baseline on 3 L, HTN presenting today for shortness of breath.  Patient reportedly has had worsening shortness of breath at home over the past several days and was found to be hypoxic on his baseline 3 L.  He was getting albuterol treatments when EMS picked him up.  Transition over to CPAP with ongoing breathing treatments.  No disorientation or confusion from his baseline.  Chronically bedbound.  He states feeling short of breath over the past couple of days but otherwise denies chest pain, abdominal pain, nausea, vomiting.  Spoke with wife at bedside who confirmed that patient is a DNR from a chest compressions standpoint but okay with intubation or all other treatments if needed.     Physical Exam   Triage Vital Signs: ED Triage Vitals  Encounter Vitals Group     BP 12/03/23 1642 105/65     Systolic BP Percentile --      Diastolic BP Percentile --      Pulse Rate 12/03/23 1640 (!) 115     Resp 12/03/23 1640 (!) 21     Temp --      Temp src --      SpO2 12/03/23 1640 (!) 85 %     Weight 12/03/23 1642 147 lb 11.3 oz (67 kg)     Height 12/03/23 1642 6\' 1"  (1.854 m)     Head Circumference --      Peak Flow --      Pain Score 12/03/23 1639 0     Pain Loc --      Pain Education --      Exclude from Growth Chart --     Most recent vital signs: Vitals:   12/03/23 1730 12/03/23 1800  BP: 133/79 (!) 118/58  Pulse: (!) 111 99  Resp: 17 18  Temp:    SpO2: 99% 99%   Physical Exam: I have reviewed the vital signs and nursing notes. General: Awake, alert, respiratory distress Head:  Atraumatic, normocephalic.   ENT:  EOM intact, PERRL. Oral mucosa is pink and moist with no  lesions. Neck: Neck is supple with full range of motion, No meningeal signs. Cardiovascular:  tachycardia, RR, No murmurs. Peripheral pulses palpable and equal bilaterally. Respiratory:  Symmetrical chest wall expansion.  Diminished breath sounds bilaterally with mild tachypnea.  No obvious wheezing present at this time. Musculoskeletal:  No cyanosis or edema. Moving extremities with full ROM Abdomen:  Soft, nontender, nondistended. Neuro:  GCS 15, moving all four extremities, interacting appropriately. Speech clear. Psych:  Calm, appropriate.   Skin:  Warm, dry, no rash.    ED Results / Procedures / Treatments   Labs (all labs ordered are listed, but only abnormal results are displayed) Labs Reviewed  LACTIC ACID, PLASMA - Abnormal; Notable for the following components:      Result Value   Lactic Acid, Venous 2.0 (*)    All other components within normal limits  COMPREHENSIVE METABOLIC PANEL WITH GFR - Abnormal; Notable for the following components:   Glucose, Bld 139 (*)    BUN 25 (*)    Creatinine, Ser 1.27 (*)    Calcium  8.3 (*)  Albumin 2.9 (*)    All other components within normal limits  CBC WITH DIFFERENTIAL/PLATELET - Abnormal; Notable for the following components:   WBC 20.4 (*)    RBC 3.71 (*)    Hemoglobin 10.3 (*)    HCT 32.8 (*)    Neutro Abs 14.7 (*)    Lymphs Abs 4.1 (*)    Monocytes Absolute 1.1 (*)    Basophils Absolute 0.2 (*)    Abs Immature Granulocytes 0.12 (*)    All other components within normal limits  PROTIME-INR - Abnormal; Notable for the following components:   Prothrombin Time 21.2 (*)    INR 1.8 (*)    All other components within normal limits  BLOOD GAS, VENOUS - Abnormal; Notable for the following components:   Bicarbonate 29.1 (*)    Acid-Base Excess 3.3 (*)    All other components within normal limits  RESP PANEL BY RT-PCR (RSV, FLU A&B, COVID)  RVPGX2  CULTURE, BLOOD (ROUTINE X 2)  CULTURE, BLOOD (ROUTINE X 2)  LACTIC ACID,  PLASMA  SAMPLE TO BLOOD BANK     EKG My EKG interpretation: Rate of 18, sinus tachycardia, normal axis.  Slightly prolonged QTc at 526.  No acute ST elevations or depressions   RADIOLOGY Independently interpreted chest x-ray with evidence of left lower lobe pneumonia   PROCEDURES:  Critical Care performed: Yes, see critical care procedure note(s)  .Critical Care  Performed by: Kandee Orion, MD Authorized by: Kandee Orion, MD   Critical care provider statement:    Critical care time (minutes):  30   Critical care was necessary to treat or prevent imminent or life-threatening deterioration of the following conditions:  Respiratory failure and sepsis   Critical care was time spent personally by me on the following activities:  Development of treatment plan with patient or surrogate, discussions with consultants, evaluation of patient's response to treatment, examination of patient, ordering and review of laboratory studies, ordering and review of radiographic studies, ordering and performing treatments and interventions, pulse oximetry, re-evaluation of patient's condition and review of old charts   I assumed direction of critical care for this patient from another provider in my specialty: no     Care discussed with: admitting provider      MEDICATIONS ORDERED IN ED: Medications  azithromycin (ZITHROMAX) 500 mg in sodium chloride  0.9 % 250 mL IVPB (500 mg Intravenous New Bag/Given 12/03/23 1729)  cefTRIAXone (ROCEPHIN) 2 g in sodium chloride  0.9 % 100 mL IVPB (0 g Intravenous Stopped 12/03/23 1722)  ipratropium-albuterol (DUONEB) 0.5-2.5 (3) MG/3ML nebulizer solution 3 mL (3 mLs Nebulization Given 12/03/23 1646)  sodium chloride  0.9 % bolus 1,000 mL (1,000 mLs Intravenous New Bag/Given 12/03/23 1650)     IMPRESSION / MDM / ASSESSMENT AND PLAN / ED COURSE  I reviewed the triage vital signs and the nursing notes.                              Differential diagnosis includes, but is  not limited to, COPD exacerbation, pneumonia,, pneumothorax, COVID, flu, RSV  Patient's presentation is most consistent with acute presentation with potential threat to life or bodily function.  Patient is a 63 year old male presenting today for worsening shortness of breath with baseline COPD on 3 L.  Hypoxic at home and brought in on CPAP and still hypoxic.  Transitioned over to the BiPAP with appropriate correction and no ongoing hypoxia.  Diminished breath  sounds in the bases bilaterally.  Will give additional DuoNeb, 1 L NS, and ceftriaxone/azithromycin for suspected pneumonia.  Patient meeting sepsis criteria and blood cultures obtained along with lactic.  Chest x-ray confirms left lower lobe pneumonia.  Lactic acid of 2.0.  Patient did have significant improvement on the BiPAP with no hypercapnia.  Negative for COVID, flu, RSV.  Spoke with wife at bedside but stated they would be fine with him having intubation but they did not want any chest compressions should his heart stop.  Patient will be admitted to hospitalist for ongoing care for sepsis secondary pneumonia with hypoxic respiratory failure.  The patient is on the cardiac monitor to evaluate for evidence of arrhythmia and/or significant heart rate changes.     FINAL CLINICAL IMPRESSION(S) / ED DIAGNOSES   Final diagnoses:  Acute hypoxic respiratory failure (HCC)  Pneumonia of left lower lobe due to infectious organism  Sepsis, due to unspecified organism, unspecified whether acute organ dysfunction present [facility])     Rx / DC Orders   ED Discharge Orders     None        Note:  This document was prepared using Dragon voice recognition software and may include unintentional dictation errors.   Kandee Orion, MD 12/03/23 [phone]

## 2023-12-03 NOTE — H&P (Signed)
 History and Physical    Cory Moses ZOX:[id number] DOB: [birth year] DOA: 12/03/2023  Referring MD/NP/PA:   PCP: System, Provider Not In   Patient coming from:  The patient is coming from home.     Chief Complaint: SOB  HPI: Cory Moses is a 63 y.o. 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  Per patient, his daughter and wife at the bedside, pt has SOB in the past 3 days, which has been progressively worsening.  He has cough with yellow-colored sputum production.  He has mild frontal chest pain, no fever or chills.  No nausea, vomiting, diarrhea or abdominal pain.  Per his wife, his oxygen desaturation to 70% on his 2 L oxygen.  Per EMS report, patient was found to have severe respiratory distress, oxygen desaturation to 70s, CPAP was started by EMS, which was changed to BiPAP in the ED due to severe respiratory distress.  Data reviewed independently and ED Course: pt was found to have WBC 20.4, GFR> 60, negative PCR for COVID, flu and RSV, lactic acid 2.0 --> 3.0 --> 3.6.  Temperature normal, blood pressure 109/65, heart rate 115, RR 21.  VBG with pH 7.39, CO2 48, O2 78.4.  Chest x-ray showed left lower lobe infiltration with small pleural effusion and chronic interstitial change.  Patient is admitted to PCU as inpatient.  Chest x-ray: Collapse or consolidation in the left lower lung with small left pleural effusion. Chronic interstitial changes in the lungs, likely fibrosis.   EKG: I have personally reviewed.  Sinus rhythm, QTc 526, LAE, anteroseptal infarction pattern   Review of Systems:   General: no fevers, chills, no body weight gain, has fatigue HEENT: no blurry vision, hearing changes or sore throat Respiratory: has  dyspnea, coughing, no wheezing CV: has chest pain, no palpitations GI: no nausea, vomiting, abdominal pain, diarrhea, constipation GU: no dysuria, burning on urination, increased urinary frequency, hematuria  Ext: no leg edema Neuro: no unilateral weakness, numbness, or tingling, no vision change or hearing loss Skin: has ulcers in both lower legs and feet MSK: No muscle spasm, no deformity, no limitation of range of movement in spin Heme: No easy bruising.  Travel history: No recent long distant travel.   Allergy:  Allergies  Allergen Reactions   Lyrica [Pregabalin]    Morphine Other (See Comments)    Other reaction(s): Other (See Comments), Other (See Comments)  Doesn't like Morphine, mental status changes.  Doesn't like Morphine  Doesn't like Morphine, mental status changes.    Doesn't like Morphine    Other reaction(s): Other (See Comments), Other (See Comments) Doesn't like Morphine, mental status changes. Doesn't like Morphine  Other Reaction(s): Other (See Comments)  Doesn't like Morphine, mental status changes.    Doesn't like Morphine    Other reaction(s): Other (See Comments), Other (See Comments) Doesn't like Morphine, mental status changes. Doesn't like Morphine    Other reaction(s): Other (See Comments), Other (See Comments)  Doesn't like Morphine, mental status changes.  Doesn't like Morphine  Doesn't like Morphine, mental status changes.  Doesn't like Morphine  Other reaction(s): Other (See Comments), Other (See Comments) Doesn't like Morphine, mental status changes. Doesn't like Morphine   Morphine And Codeine Anxiety    Past Medical History:  Diagnosis Date   COPD (chronic obstructive pulmonary disease) (  HCC)    Depression with anxiety    Factor V Leiden mutation (HCC)    HLD (hyperlipidemia)    HTN (hypertension)    PAD (peripheral artery disease) (HCC)    Seizures (HCC)    Vascular dementia (HCC)     Past Surgical History:  Procedure Laterality  Date   BACK SURGERY     LEG SURGERY Bilateral    legs was broken   LOWER EXTREMITY ANGIOGRAPHY Left 11/12/2023   Procedure: Lower Extremity Angiography;  Surgeon: Celso College, MD;  Location: [facility] IN[facility]ASIVE [facility];  Service: Cardiovascular;  Laterality: Left;    Social History:  reports that he has quit smoking. His smoking use included cigarettes. He has been exposed to tobacco smoke. He has never used smokeless tobacco. He reports that he does not drink alcohol and does not use drugs.  Family History:  Family History  Problem Relation Age of Onset   Stroke Mother    Peripheral Artery Disease Father      Prior to Admission medications   Medication Sig Start Date End Date Taking? Authorizing Provider  albuterol (PROVENTIL HFA;VENTOLIN HFA) 108 (90 Base) MCG/ACT inhaler Inhale 2 puffs into the lungs every 4 (four) hours as needed for wheezing or shortness of breath.    [provider]  alfuzosin  (UROXATRAL ) 10 MG 24 hr tablet Take 1 tablet (10 mg total) by mouth daily with breakfast. 08/31/23   Lawerence Pressman, MD  amLODipine (NORVASC) 10 MG tablet Take 10 mg by mouth daily. 12/22/21   [provider]  apixaban  (ELIQUIS ) 5 MG TABS tablet Take 5 mg by mouth 2 (two) times daily.    [provider]  Ascorbic Acid (VITAMIN C) 250 MG CHEW Chew by mouth. 06/02/22   [provider]  ASPERCREME LIDOCAINE  4 % CREA Apply topically. 08/17/23   [provider]  aspirin  EC 81 MG tablet Take 1 tablet (81 mg total) by mouth daily. Swallow whole. 11/12/23 11/11/24  Celso College, MD  atorvastatin  (LIPITOR) 10 MG tablet Take 1 tablet (10 mg total) by mouth daily. 11/12/23 11/11/24  Celso College, MD  BEN GAY ULTRA STRENGTH 10-10-28 % CREA Apply topically. 07/26/23   [provider]  bisacodyl (DULCOLAX) 10 MG suppository Place 10 mg rectally. 08/04/23   [provider]  Budeson-Glycopyrrol-Formoterol (BREZTRI AEROSPHERE) 160-9-4.8 MCG/ACT AERO Inhale into  the lungs. 01/08/23 01/08/24  [provider]  Calcium  Carb-Cholecalciferol 600-10 MG-MCG TABS Take 1 tablet by mouth 2 (two) times daily. 06/02/22   [provider]  clonazePAM  (KLONOPIN ) 0.5 MG tablet Take 1 mg by mouth 2 (two) times daily. 12/24/21   [provider]  dutasteride  (AVODART ) 0.5 MG capsule Take 1 capsule (0.5 mg total) by mouth daily. 08/29/23   Lawerence Pressman, MD  Ensure (ENSURE) Take by mouth. 08/20/23   [provider]  esomeprazole (NEXIUM) 40 MG capsule Take 40 mg by mouth daily.    [provider]  FLUoxetine (PROZAC) 40 MG capsule Take 40 mg by mouth daily. 08/04/23   [provider]  fluticasone (FLONASE) 50 MCG/ACT nasal spray Place into both nostrils. 06/28/23   [provider]  gabapentin  (NEURONTIN ) 600 MG tablet Take 600 mg by mouth daily. 08/01/23   [provider]  GAS RELIEF EXTRA STRENGTH 125 MG chewable tablet Chew 125 mg by mouth. 08/03/23   [provider]  GERI-TUSSIN 100 MG/5ML liquid  08/24/23   [provider]  hydrocortisone cream 1 %  Apply topically. 08/09/23   [provider]  lactulose (CHRONULAC) 10 GM/15ML solution  06/02/22   [provider]  lisinopril  (ZESTRIL ) 2.5 MG tablet Take 2.5 mg by mouth daily. 12/22/21   [provider]  loperamide (IMODIUM) 2 MG capsule Take 2 mg by mouth 2 (two) times daily. 07/16/23   [provider]  lubiprostone (AMITIZA) 8 MCG capsule Take 8 mcg by mouth. 08/04/23   [provider]  melatonin 3 MG TABS tablet Take by mouth. 08/24/21   [provider]  methadone  (DOLOPHINE ) 10 MG tablet Take 10 mg by mouth 3 (three) times daily as needed. 12/22/21   [provider]  mirtazapine (REMERON) 7.5 MG tablet Take 7.5 mg by mouth at bedtime. 08/04/23   [provider]  nystatin (MYCOSTATIN/NYSTOP) powder Apply 1 Application topically 2 (two) times daily. 08/09/23   [provider]   OXYGEN 2 L at bedtime.    [provider]  polyethylene glycol powder (GLYCOLAX/MIRALAX) 17 GM/SCOOP powder Take 17 g by mouth daily. 08/04/23   [provider]  predniSONE  (STERAPRED UNI-PAK 21 TAB) 10 MG (21) TBPK tablet As directed on packaging 11/16/23   Bradler, Evan K, MD  risperiDONE (RISPERDAL) 1 MG tablet Take by mouth. 12/22/21   [provider]  senna-docusate (SENOKOT-S) 8.6-50 MG tablet Take by mouth.    [provider]  thiamine 100 MG tablet Take 100 mg by mouth daily. 08/04/23   [provider]    Physical Exam: Vitals:   12/03/23 2306 12/03/23 2308 12/04/23 0033 12/04/23 0127  BP:   122/62   Pulse:      Resp:  17 17   Temp:   98.7 F (37.1 C)   TempSrc:   Oral   SpO2: 99%  98% 96%  Weight:      Height:       General: Has acute respiratory distress. HEENT:       Eyes: PERRL, EOMI, no jaundice       ENT: No discharge from the ears and nose, no pharynx injection, no tonsillar enlargement.        Neck: No JVD, no bruit, no mass felt. Heme: No neck lymph node enlargement. Cardiac: S1/S2, RRR, No murmurs, No gallops or rubs. Respiratory: Has coarse breathing sound bilaterally, but no wheezing GI: Soft, nondistended, nontender, no rebound pain, no organomegaly, BS present. GU: No hematuria Ext: No pitting leg edema bilaterally. 1+DP/PT pulse bilaterally. Musculoskeletal: No joint deformities, No joint redness or warmth, no limitation of ROM in spin. Skin: Patient has chronic ulcer in both legs, scabbed ulcer in both feet              Neuro: Alert, oriented X3, cranial nerves II-XII grossly intact, moves all extremities normally.  Psych: Patient is not psychotic, no suicidal or hemocidal ideation.  Labs on Admission: I have personally reviewed following labs and imaging studies  CBC: Recent Labs  Lab 12/03/23 1646  WBC 20.4*  NEUTROABS 14.7*  HGB 10.3*  HCT 32.8*  MCV 88.4  PLT 340   Basic Metabolic  Panel: Recent Labs  Lab 12/03/23 1646  NA 140  K 3.9  CL 102  CO2 24  GLUCOSE 139*  BUN 25*  CREATININE 1.27*  CALCIUM  8.3*  MG 2.1   GFR: Estimated Creatinine Clearance: 53.9 mL/min (A) (by C-G formula based on SCr of 1.27 mg/dL (H)). Liver Function Tests: Recent Labs  Lab 12/03/23 1646  AST 19  ALT 8  ALKPHOS 58  BILITOT 0.7  PROT 6.9  ALBUMIN 2.9*   No results for input(s): "LIPASE", "AMYLASE" in the last 168 hours. No results for input(s): "AMMONIA" in the last 168 hours. Coagulation Profile: Recent Labs  Lab 12/03/23 1646  INR 1.8*   Cardiac Enzymes: No results for input(s): "CKTOTAL", "CKMB", "CKMBINDEX", "TROPONINI" in the last 168 hours. BNP (last 3 results) No results for input(s): "PROBNP" in the last 8760 hours. HbA1C: No results for input(s): "HGBA1C" in the last 72 hours. CBG: Recent Labs  Lab 12/04/23 0206  GLUCAP 236*   Lipid Profile: No results for input(s): "CHOL", "HDL", "LDLCALC", "TRIG", "CHOLHDL", "LDLDIRECT" in the last 72 hours. Thyroid Function Tests: No results for input(s): "TSH", "T4TOTAL", "FREET4", "T3FREE", "THYROIDAB" in the last 72 hours. Anemia Panel: No results for input(s): "VITAMINB12", "FOLATE", "FERRITIN", "TIBC", "IRON", "RETICCTPCT" in the last 72 hours. Urine analysis:    Component Value Date/Time   COLORURINE YELLOW 12/27/2021 1410   APPEARANCEUR HAZY (A) 12/27/2021 1410   LABSPEC 1.020 12/27/2021 1410   PHURINE 5.5 12/27/2021 1410   GLUCOSEU NEGATIVE 12/27/2021 1410   HGBUR NEGATIVE 12/27/2021 1410   BILIRUBINUR SMALL (A) 12/27/2021 1410   KETONESUR TRACE (A) 12/27/2021 1410   PROTEINUR TRACE (A) 12/27/2021 1410   NITRITE NEGATIVE 12/27/2021 1410   LEUKOCYTESUR NEGATIVE 12/27/2021 1410   Sepsis Labs: @LABRCNTIP (procalcitonin:4,lacticidven:4) ) Recent Results (from the past 240 hours)  Resp panel by RT-PCR (RSV, Flu A&B, Covid) Anterior Nasal Swab     Status: None   Collection Time: 12/03/23  4:46 PM    Specimen: Anterior Nasal Swab  Result Value Ref Range Status   SARS Coronavirus 2 by RT PCR NEGATIVE NEGATIVE Final    Comment: (NOTE) SARS-CoV-2 target nucleic acids are NOT DETECTED.  The SARS-CoV-2 RNA is generally detectable in upper respiratory specimens during the acute phase of infection. The lowest concentration of SARS-CoV-2 viral copies this assay can detect is 138 copies/mL. A negative result does not preclude SARS-Cov-2 infection and should not be used as the sole basis for treatment or other patient management decisions. A negative result may occur with  improper specimen collection/handling, submission of specimen other than nasopharyngeal swab, presence of viral mutation(s) within the areas targeted by this assay, and inadequate number of viral copies(<138 copies/mL). A negative result must be combined with clinical observations, patient history, and epidemiological information. The expected result is Negative.  Fact Sheet for Patients:  BloggerCourse.com  Fact Sheet for Healthcare Providers:  SeriousBroker.it  This test is no t yet approved or cleared by the [place]  FDA and  has been authorized for detection and/or diagnosis of SARS-CoV-2 by FDA under an Emergency Use Authorization (EUA). This EUA will remain  in effect (meaning this test can be used) for the duration of the COVID-19 declaration under Section 564(b)(1) of the Act, 21 U.S.C.section 360bbb-3(b)(1), unless the authorization is terminated  or revoked sooner.       Influenza A by PCR NEGATIVE NEGATIVE Final   Influenza B by PCR NEGATIVE NEGATIVE Final    Comment: (NOTE) The Xpert Xpress SARS-CoV-2/FLU/RSV plus assay is intended as an aid in the diagnosis of influenza from Nasopharyngeal swab specimens and should not be used as a sole basis for treatment. Nasal washings and aspirates are unacceptable for Xpert Xpress  SARS-CoV-2/FLU/RSV testing.  Fact Sheet for Patients: BloggerCourse.com  Fact Sheet for Healthcare Providers: SeriousBroker.it  This test is not yet approved or cleared by the [place]  FDA and has been authorized  for detection and/or diagnosis of SARS-CoV-2 by FDA under an Emergency Use Authorization (EUA). This EUA will remain in effect (meaning this test can be used) for the duration of the COVID-19 declaration under Section 564(b)(1) of the Act, 21 U.S.C. section 360bbb-3(b)(1), unless the authorization is terminated or revoked.     Resp Syncytial Virus by PCR NEGATIVE NEGATIVE Final    Comment: (NOTE) Fact Sheet for Patients: BloggerCourse.com  Fact Sheet for Healthcare Providers: SeriousBroker.it  This test is not yet approved or cleared by the [place]  FDA and has been authorized for detection and/or diagnosis of SARS-CoV-2 by FDA under an Emergency Use Authorization (EUA). This EUA will remain in effect (meaning this test can be used) for the duration of the COVID-19 declaration under Section 564(b)(1) of the Act, 21 U.S.C. section 360bbb-3(b)(1), unless the authorization is terminated or revoked.  Performed at [facility], [address] [place], [place] [address]      Radiological Exams on Admission:   Assessment/Plan Principal Problem:   CAP (community acquired pneumonia) Active Problems:   Acute on chronic respiratory failure with hypoxia (HCC)   Severe sepsis (HCC)   COPD (chronic obstructive pulmonary disease) (HCC)   HTN (hypertension)   HLD (hyperlipidemia)   Factor V Leiden mutation (HCC)   Prolonged QT interval   BPH (benign prostatic hyperplasia)   Chronic pain syndrome   PAD (peripheral artery disease) (HCC)   Depression with anxiety   Ulcers of both lower legs (HCC)   Assessment and Plan:  Severe sepsis and acute on  chronic respiratory failure with hypoxia due to CAP: Patient meets criteria for severe sepsis with WBC 20.4, heart rate up to 115, RR 21.  Lactic acid 2.0 --> 3.0 --> 3.6.  - Will admit to PCU   as inpt - try to wean off BIPAP - IV Rocephin and doxycycline (D/C aztreonam due to QTc prolongation) - Incentive spirometry - Gave Solu-Medrol  125 mg 1 dose - Mucinex for cough  - Bronchodilators - Urine legionella and S. pneumococcal antigen - Follow up blood culture x2, sputum culture - trend lactic acid level per sepsis protocol - IVF: 2L of NS bolus in ED, followed by 75 mL per hour of NS   COPD (chronic obstructive pulmonary disease) (HCC): No wheezing. -Bronchodilators as above  HTN (hypertension) -IV hydralazine  as needed - Hold blood pressure medications (amlodipine and lisinopril ) due to sepsis and high risk of developing hypotension,  HLD (hyperlipidemia) -Lipitor  Factor V Leiden mutation (HCC) -Eliquis   Prolonged QT interval -Hold her risperidone, Prozac, Remeron - Check magnesium level --> 2.1  BPH (benign prostatic hyperplasia) -Dutasteride  and Uroxatral   Chronic pain syndrome -Methadone   PAD (peripheral artery disease) (HCC) - Aspirin , Lipitor  Depression with anxiety -Continue Klonopin  - Hold Prozac, risperidone, Remeron due to QTc prolongation  Ulcers of both lower legs (HCC) - Wound care consult         DVT ppx: on Eliquis   Code Status:  Patient is partial code, okay for intubation, no CPR per pt and his wife  Family Communication:    Yes, patient's wife and daughter   at bed side.  Disposition Plan:  Anticipate discharge back to previous environment  Consults called:  none  Admission status and Level of care: Progressive:  as inpt        Dispo: The patient is from: Home              Anticipated d/c is to: Home  Anticipated d/c date is: 2 days              Patient currently is not medically stable to d/c.    Severity of  Illness:  The appropriate patient status for this patient is INPATIENT. Inpatient status is judged to be reasonable and necessary in order to provide the required intensity of service to ensure the patient's safety. The patient's presenting symptoms, physical exam findings, and initial radiographic and laboratory data in the context of their chronic comorbidities is felt to place them at high risk for further clinical deterioration. Furthermore, it is not anticipated that the patient will be medically stable for discharge from the hospital within 2 midnights of admission.   * I certify that at the point of admission it is my clinical judgment that the patient will require inpatient hospital care spanning beyond 2 midnights from the point of admission due to high intensity of service, high risk for further deterioration and high frequency of surveillance required.*       Date of Service 12/04/2023    Fidencio Hue [facility]   If 7PM-7AM, please contact night-coverage [email or web address] 12/04/2023, 2:30 AM

## 2023-12-03 NOTE — Sepsis Progress Note (Signed)
Notified provider of need to order 3rd lactic acid.

## 2023-12-04 ENCOUNTER — Inpatient Hospital Stay: Payer: Medicare (Managed Care)

## 2023-12-04 DIAGNOSIS — R6520 Severe sepsis without septic shock: Secondary | ICD-10-CM | POA: Diagnosis present

## 2023-12-04 DIAGNOSIS — L97919 Non-pressure chronic ulcer of unspecified part of right lower leg with unspecified severity: Secondary | ICD-10-CM | POA: Diagnosis not present

## 2023-12-04 DIAGNOSIS — G894 Chronic pain syndrome: Secondary | ICD-10-CM | POA: Diagnosis present

## 2023-12-04 DIAGNOSIS — I739 Peripheral vascular disease, unspecified: Secondary | ICD-10-CM

## 2023-12-04 DIAGNOSIS — D6851 Activated protein C resistance: Secondary | ICD-10-CM

## 2023-12-04 DIAGNOSIS — A419 Sepsis, unspecified organism: Principal | ICD-10-CM

## 2023-12-04 DIAGNOSIS — L97929 Non-pressure chronic ulcer of unspecified part of left lower leg with unspecified severity: Secondary | ICD-10-CM | POA: Diagnosis not present

## 2023-12-04 LAB — BRAIN NATRIURETIC PEPTIDE: B Natriuretic Peptide: 116.7 pg/mL — ABNORMAL HIGH (ref 0.0–100.0)

## 2023-12-04 LAB — GLUCOSE, CAPILLARY: Glucose-Capillary: 236 mg/dL — ABNORMAL HIGH (ref 70–99)

## 2023-12-04 LAB — BLOOD CULTURE ID PANEL (REFLEXED) - BCID2

## 2023-12-04 LAB — CBC
HCT: 28.2 % — ABNORMAL LOW (ref 39.0–52.0)
Hemoglobin: 8.8 g/dL — ABNORMAL LOW (ref 13.0–17.0)
MCH: 27.2 pg (ref 26.0–34.0)
MCHC: 31.2 g/dL (ref 30.0–36.0)
MCV: 87.3 fL (ref 80.0–100.0)
Platelets: 303 10*3/uL (ref 150–400)
RBC: 3.23 MIL/uL — ABNORMAL LOW (ref 4.22–5.81)
RDW: 14.9 % (ref 11.5–15.5)
WBC: 18.9 10*3/uL — ABNORMAL HIGH (ref 4.0–10.5)
nRBC: 0 % (ref 0.0–0.2)

## 2023-12-04 LAB — BASIC METABOLIC PANEL WITH GFR
Anion gap: 10 (ref 5–15)
BUN: 24 mg/dL — ABNORMAL HIGH (ref 8–23)
CO2: 25 mmol/L (ref 22–32)
Calcium: 7.9 mg/dL — ABNORMAL LOW (ref 8.9–10.3)
Chloride: 107 mmol/L (ref 98–111)
Creatinine, Ser: 0.91 mg/dL (ref 0.61–1.24)
GFR, Estimated: >60 mL/min (ref >60)
Glucose, Bld: 199 mg/dL — ABNORMAL HIGH (ref 70–99)
Potassium: 4.2 mmol/L (ref 3.5–5.1)
Sodium: 142 mmol/L (ref 135–145)

## 2023-12-04 LAB — LACTIC ACID, PLASMA
Lactic Acid, Venous: 1.9 mmol/L (ref 0.5–1.9)
Lactic Acid, Venous: 1.4 mmol/L (ref 0.5–1.9)

## 2023-12-04 LAB — IRON AND TIBC
Iron: 10 ug/dL — ABNORMAL LOW (ref 45–182)
Saturation Ratios: 4 % — ABNORMAL LOW (ref 17.9–39.5)
TIBC: 231 ug/dL — ABNORMAL LOW (ref 250–450)
UIBC: 221 ug/dL

## 2023-12-04 LAB — FERRITIN: Ferritin: 143 ng/mL (ref 24–336)

## 2023-12-04 LAB — HIV ANTIBODY (ROUTINE TESTING W REFLEX): HIV Screen 4th Generation wRfx: NONREACTIVE

## 2023-12-04 LAB — VITAMIN B12: Vitamin B-12: 303 pg/mL (ref 180–914)

## 2023-12-04 MED ORDER — AMLODIPINE BESYLATE 10 MG PO TABS: 10.0000 mg | ORAL_TABLET | Freq: Every day | ORAL | Status: DC

## 2023-12-04 MED ORDER — IPRATROPIUM-ALBUTEROL 0.5-2.5 (3) MG/3ML IN SOLN
3.0000 mL | Freq: Four times a day (QID) | RESPIRATORY_TRACT | Status: DC
  Administered 2023-12-04 (×3): 3 mL via RESPIRATORY_TRACT
  Filled 2023-12-04 (×3): qty 3

## 2023-12-04 MED ORDER — ZINC SULFATE 220 (50 ZN) MG PO CAPS
220.0000 mg | ORAL_CAPSULE | Freq: Every day | ORAL | Status: DC
  Administered 2023-12-04 – 2023-12-07 (×4): 220 mg via ORAL
  Filled 2023-12-04 (×4): qty 1

## 2023-12-04 MED ORDER — LISINOPRIL 5 MG PO TABS: 2.5000 mg | ORAL_TABLET | Freq: Every day | ORAL | Status: DC

## 2023-12-04 MED ORDER — LUBIPROSTONE 8 MCG PO CAPS
8.0000 ug | ORAL_CAPSULE | Freq: Two times a day (BID) | ORAL | Status: DC
  Administered 2023-12-04 – 2023-12-07 (×7): 8 ug via ORAL
  Filled 2023-12-04 (×8): qty 1

## 2023-12-04 MED ORDER — OYSTER SHELL CALCIUM/D3 500-5 MG-MCG PO TABS
1.0000 | ORAL_TABLET | Freq: Two times a day (BID) | ORAL | Status: DC
  Administered 2023-12-04 – 2023-12-07 (×7): 1 via ORAL
  Filled 2023-12-04 (×7): qty 1

## 2023-12-04 MED ORDER — APIXABAN 5 MG PO TABS
5.0000 mg | ORAL_TABLET | Freq: Two times a day (BID) | ORAL | Status: DC
  Administered 2023-12-04 – 2023-12-07 (×8): 5 mg via ORAL
  Filled 2023-12-04 (×8): qty 1

## 2023-12-04 MED ORDER — METHADONE HCL 10 MG PO TABS
10.0000 mg | ORAL_TABLET | Freq: Three times a day (TID) | ORAL | Status: DC
  Administered 2023-12-04 – 2023-12-07 (×11): 10 mg via ORAL
  Filled 2023-12-04 (×11): qty 1

## 2023-12-04 MED ORDER — ALFUZOSIN HCL ER 10 MG PO TB24
10.0000 mg | ORAL_TABLET | Freq: Every day | ORAL | Status: DC
  Administered 2023-12-04 – 2023-12-07 (×4): 10 mg via ORAL
  Filled 2023-12-04 (×4): qty 1

## 2023-12-04 MED ORDER — VITAMIN C 500 MG PO TABS: 250.0000 mg | ORAL_TABLET | Freq: Every day | ORAL | Status: DC

## 2023-12-04 MED ORDER — SENNOSIDES-DOCUSATE SODIUM 8.6-50 MG PO TABS
2.0000 | ORAL_TABLET | Freq: Two times a day (BID) | ORAL | Status: DC
  Administered 2023-12-04 – 2023-12-05 (×4): 2 via ORAL
  Filled 2023-12-04 (×4): qty 2

## 2023-12-04 MED ORDER — SODIUM CHLORIDE 0.9 % IV SOLN
3.0000 g | Freq: Four times a day (QID) | INTRAVENOUS | Status: DC
  Administered 2023-12-04 – 2023-12-07 (×12): 3 g via INTRAVENOUS
  Filled 2023-12-04 (×13): qty 8

## 2023-12-04 MED ORDER — SODIUM CHLORIDE 0.9 % IV SOLN
3.0000 g | Freq: Three times a day (TID) | INTRAVENOUS | Status: DC
  Administered 2023-12-04: 3 g via INTRAVENOUS
  Filled 2023-12-04: qty 8

## 2023-12-04 MED ORDER — CLONAZEPAM 0.5 MG PO TABS
0.5000 mg | ORAL_TABLET | Freq: Every day | ORAL | Status: DC
  Administered 2023-12-04 – 2023-12-07 (×4): 0.5 mg via ORAL
  Filled 2023-12-04 (×4): qty 1

## 2023-12-04 MED ORDER — ENSURE PLUS HIGH PROTEIN PO LIQD
237.0000 mL | Freq: Two times a day (BID) | ORAL | Status: DC
  Administered 2023-12-05 – 2023-12-07 (×4): 237 mL via ORAL

## 2023-12-04 MED ORDER — IPRATROPIUM-ALBUTEROL 0.5-2.5 (3) MG/3ML IN SOLN
3.0000 mL | Freq: Two times a day (BID) | RESPIRATORY_TRACT | Status: DC
  Administered 2023-12-05: 3 mL via RESPIRATORY_TRACT
  Filled 2023-12-04: qty 3

## 2023-12-04 MED ORDER — POLYETHYLENE GLYCOL 3350 17 G PO PACK
17.0000 g | PACK | Freq: Every day | ORAL | Status: DC
  Administered 2023-12-04 – 2023-12-05 (×2): 17 g via ORAL
  Filled 2023-12-04 (×2): qty 1

## 2023-12-04 MED ORDER — ASPIRIN 81 MG PO TBEC
81.0000 mg | DELAYED_RELEASE_TABLET | Freq: Every day | ORAL | Status: DC
  Administered 2023-12-04 – 2023-12-07 (×4): 81 mg via ORAL
  Filled 2023-12-04 (×4): qty 1

## 2023-12-04 MED ORDER — THIAMINE MONONITRATE 100 MG PO TABS
100.0000 mg | ORAL_TABLET | Freq: Every day | ORAL | Status: DC
  Administered 2023-12-04 – 2023-12-07 (×4): 100 mg via ORAL
  Filled 2023-12-04 (×4): qty 1

## 2023-12-04 MED ORDER — CLONAZEPAM 0.5 MG PO TABS
1.0000 mg | ORAL_TABLET | Freq: Two times a day (BID) | ORAL | Status: DC
  Administered 2023-12-04 – 2023-12-07 (×8): 1 mg via ORAL
  Filled 2023-12-04 (×8): qty 2

## 2023-12-04 MED ORDER — PANTOPRAZOLE SODIUM 40 MG PO TBEC
40.0000 mg | DELAYED_RELEASE_TABLET | Freq: Every day | ORAL | Status: DC
  Administered 2023-12-04 – 2023-12-07 (×4): 40 mg via ORAL
  Filled 2023-12-04 (×4): qty 1

## 2023-12-04 MED ORDER — POLYETHYLENE GLYCOL 3350 17 GM/SCOOP PO POWD
17.0000 g | Freq: Every day | ORAL | Status: DC
  Filled 2023-12-04: qty 119

## 2023-12-04 MED ORDER — LACTULOSE 10 GM/15ML PO SOLN
20.0000 g | Freq: Every day | ORAL | Status: DC
  Administered 2023-12-04 – 2023-12-05 (×2): 20 g via ORAL
  Filled 2023-12-04 (×2): qty 30

## 2023-12-04 MED ORDER — VITAMIN C 500 MG PO TABS
500.0000 mg | ORAL_TABLET | Freq: Two times a day (BID) | ORAL | Status: DC
  Administered 2023-12-04 – 2023-12-07 (×7): 500 mg via ORAL
  Filled 2023-12-04 (×7): qty 1

## 2023-12-04 MED ORDER — ADULT MULTIVITAMIN W/MINERALS CH
1.0000 | ORAL_TABLET | Freq: Every day | ORAL | Status: DC
  Administered 2023-12-04 – 2023-12-07 (×4): 1 via ORAL
  Filled 2023-12-04 (×4): qty 1

## 2023-12-04 MED ORDER — LORATADINE 10 MG PO TABS
10.0000 mg | ORAL_TABLET | Freq: Every day | ORAL | Status: DC
  Administered 2023-12-04 – 2023-12-07 (×4): 10 mg via ORAL
  Filled 2023-12-04 (×4): qty 1

## 2023-12-04 MED ORDER — ATORVASTATIN CALCIUM 10 MG PO TABS
10.0000 mg | ORAL_TABLET | Freq: Every day | ORAL | Status: DC
  Administered 2023-12-04 – 2023-12-07 (×4): 10 mg via ORAL
  Filled 2023-12-04 (×4): qty 1

## 2023-12-04 MED ORDER — TESTOSTERONE 1.62 % TD GEL: 4.0000 | Freq: Every day | TRANSDERMAL | Status: DC

## 2023-12-04 MED ORDER — GABAPENTIN 300 MG PO CAPS
300.0000 mg | ORAL_CAPSULE | Freq: Two times a day (BID) | ORAL | Status: DC
  Administered 2023-12-04 – 2023-12-07 (×8): 300 mg via ORAL
  Filled 2023-12-04 (×9): qty 1

## 2023-12-04 MED ORDER — DUTASTERIDE 0.5 MG PO CAPS
0.5000 mg | ORAL_CAPSULE | Freq: Every day | ORAL | Status: DC
  Administered 2023-12-04 – 2023-12-07 (×4): 0.5 mg via ORAL
  Filled 2023-12-04 (×4): qty 1

## 2023-12-04 NOTE — Plan of Care (Signed)
  Problem: Activity: Goal: Risk for activity intolerance will decrease Outcome: Progressing   Problem: Nutrition: Goal: Adequate nutrition will be maintained Outcome: Progressing   Problem: Activity: Goal: Ability to tolerate increased activity will improve Outcome: Progressing

## 2023-12-04 NOTE — Progress Notes (Addendum)
 Pt lactic acid at 3.6. Vitals: Temp 98.7, BP 122/62 MAP 78, HR 81, RR 17 oxygen BIPAP 98 %.  Update 0235: Lactic acid at 1.9. MD Mansy made aware.

## 2023-12-04 NOTE — Evaluation (Signed)
 Clinical/Bedside Swallow Evaluation Patient Details  Name: Cory Moses MRN: [id number] Date of Birth: 07-Dec-1960  Today's Date: 12/04/2023 Time: SLP Start Time (ACUTE ONLY): 1550 SLP Stop Time (ACUTE ONLY): 1645 SLP Time Calculation (min) (ACUTE ONLY): 55 min  Past Medical History:  Past Medical History:  Diagnosis Date   COPD (chronic obstructive pulmonary disease) (HCC)    Depression with anxiety    Factor V Leiden mutation (HCC)    HLD (hyperlipidemia)    HTN (hypertension)    PAD (peripheral artery disease) (HCC)    Seizures (HCC)    Vascular dementia (HCC)    Past Surgical History:  Past Surgical History:  Procedure Laterality Date   BACK SURGERY     LEG SURGERY Bilateral    legs was broken   LOWER EXTREMITY ANGIOGRAPHY Left 11/12/2023   Procedure: Lower Extremity Angiography;  Surgeon: Celso College, MD;  Location: [facility] INVASIVE CV LAB;  Service: Cardiovascular;  Laterality: Left;   HPI:  Pt is a 63 y.o. male with medical history significant of Vascular Dementia, factor V Leyden mutation on Eliquis , hypertension, hyperlipidemia, COPD on 2 L oxygen, PVD, chronic leg ulcers, GERD, back surgery, depression with anxiety, BPH, peripheral neuropathy, chronic pain on methadone , who presents with SOB.  Patient was last seen by Dr. Mikki Alexander on 11/12/2023 where he underwent an aortogram with selective bilateral lower extremity angiograms.  He had percutaneous transluminal angioplasty of the left common femoral artery and distal external iliac artery with Lifestream stents placed at that time.  Postprocedure the patient had good blood flow to his distal bilateral lower extremities.  At this admit, patient's wife is concerned about the wound to his right lower extremity and the healing process.  Pt resides at home w/ NSG coming to the home.  Pt has PACE.  Per chart, pt has SOB in the past 3 days, which has been progressively worsening.   Per IMaging in chart:  CXR this admit- Collapse  or consolidation in the left lower lung with small left pleural effusion. Chronic interstitial changes in the lungs, likely  fibrosis.  Pt has only had 2 other CXRs since 2018 which were clear or w/ atelectasis.    Assessment / Plan / Recommendation  Clinical Impression   Pt seen for BSE. Pt sitting min reclined in bed upon entering room holding Cup and drinking via straw- Soda. NO overt s/s of aspiration noted upon entering room. Noted pt was able to hold Cup w/ support of setup/straw- tremors in UE and grip challenges noted. Wife stated she had to feed pt at home d/t his UE weakness. Wife also reported that the coughing w/ po's began when he exhibited coughing on "cough syrup that i gave him at night b/f going to bed(per MD)". Since becoming "sick in the last few days", he has had some coughing w/ meals "but not all the time". Pt lacks bottom Dentition; upper Denture plate only, and he "doesn't chew the foods that well".  On [place] 4L, afebrile. WBC trending down.   Pt appears to present w/ grossly functional pharyngeal phase swallowing --  No immediate, overt clinical s/s of aspiration during po trials, including thin liquids sipped via Straw as pt held Cup and fed self. Regarding the oral phase, pt appears to demonstrate improved bolus control and manipulation w/ swallowing and oral clearing w/ the Pureed consistency foods(as he consumed at [employer] w/ Wife today). Pt is unable to effectively masticate solid foods(as at home) d/t No  Bottom Dentition(upper plate only).  Pt appears at reduced risk for aspiration when following general aspiration precautions and using a modified food diet consistency- Purees for now(and per Wife's request). However, pt does have challenging factors that could impact oropharyngeal swallowing to include chronic Pain/discomfort(on Methadone  at baseline), deconditioning/weakness, UE tremors w/ inability to feed self(BUT able to hold Cup to drink), and Vascular Dementia. These  factors can increase risk for aspiration, dysphagia as well as decreased oral intake overall.   During po trials of thin liquids and Purees, pt consumed all trials w/ no overt coughing, decline in vocal quality, or change in respiratory presentation during/post trials. O2 sats upper 90s when checked. Oral phase appeared [facility] w/ timely bolus management and control of bolus propulsion for A-P transfer for swallowing. Oral clearing achieved w/ all trial consistencies -- moist foods given. OM Exam was cursory but appeared [facility] w/ no unilateral weakness noted. Speech Clear, intelligible. Pt helped to Hold Cup to drink which increases safety of swallowing.   Post discussion w/ Wife re: pt's overall medical and Cognitive status currently in setting of Acute illness, and w/ how well he did w/ the Pureed foods at his Lunch meal consuming ~50-75% of the meal she said(very pleased w/ the intake she said), recommend continue w/ the Pureed consistency diet(Dysphagia level 1) w/ well-moistened foods; Thin liquids -- carefully monitor straw use, and pt should help to Hold Cup when drinking. Recommend general aspiration precautions including sitting upright w/ head up/forward- use of Pillows behind lower back to support forward, Small sips/bites SLOWLY, clearing mouth b/t po, and reducing distractions/talking during po intake/meals. Pills WHOLE vs Crushed in Puree for safer, easier swallowing -- pt has been doing this at home, and it was encouraged for now and for D/C to the Wife.   Thorough Education given on Pills in Puree; food consistencies, PREP, and food options; general aspiration and Reflux precautions to pt and Wife. ST services will f/u w/ toleration of diet while admitted for ongoing education, need for objective assessment if indicated. At this time, mbss is not indicated per pt's toleration of oral intake of a modified consistency diet following aspiration precautions. NSG updated, agreed. MD updated. Recommend  Dietician and Palliative Care(for GOC) f/u for support. SLP Visit Diagnosis: Dysphagia, unspecified (R13.10) (baseline Dementia; dependent feeder)    Aspiration Risk  Mild aspiration risk;Risk for inadequate nutrition/hydration (reduced following general aspiration precautions)    Diet Recommendation   Thin;Dysphagia 1 (puree) (gravies) = he Pureed consistency diet(Dysphagia level 1) w/ well-moistened foods; Thin liquids -- carefully monitor straw use, and pt should help to Hold Cup when drinking. Recommend general aspiration precautions including sitting upright w/ head up/forward- use of Pillows behind lower back to support forward, Small sips/bites SLOWLY, clearing mouth b/t po, and reducing distractions/talking during po intake/meals.   Medication Administration: Whole meds with puree (vs Crushed in Puree)    Other  Recommendations Recommended Consults:  (Dietician; Palliative Care for GOC) Oral Care Recommendations: Oral care BID;Oral care before and after PO;Staff/trained caregiver to provide oral care (Denture care)    Recommendations for follow up therapy are one component of a multi-disciplinary discharge planning process, led by the attending physician.  Recommendations may be updated based on patient status, additional functional criteria and insurance authorization.  Follow up Recommendations Home health SLP (next venue of care as indicated)      Assistance Recommended at Discharge  FULL d/t Dementia  Functional Status Assessment Patient has had a recent decline  in their functional status and/or demonstrates limited ability to make significant improvements in function in a reasonable and predictable amount of time  Frequency and Duration min 1 x/week  1 week       Prognosis Prognosis for improved oropharyngeal function: Fair Barriers to Reach Goals: Cognitive deficits;Time post onset;Severity of deficits Barriers/Prognosis Comment: Dementia; feeding dependency; Upper Dentition  only      Swallow Study   General Date of Onset: 12/03/23 HPI: Pt is a 63 y.o. male with medical history significant of Vascular Dementia, factor V Leyden mutation on Eliquis , hypertension, hyperlipidemia, COPD on 2 L oxygen, PVD, chronic leg ulcers, GERD, back surgery, depression with anxiety, BPH, peripheral neuropathy, chronic pain on methadone , who presents with SOB.  Patient was last seen by Dr. Mikki Alexander on 11/12/2023 where he underwent an aortogram with selective bilateral lower extremity angiograms.  He had percutaneous transluminal angioplasty of the left common femoral artery and distal external iliac artery with Lifestream stents placed at that time.  Postprocedure the patient had good blood flow to his distal bilateral lower extremities.  At this admit, patient's wife is concerned about the wound to his right lower extremity and the healing process.  Pt resides at home w/ NSG coming to the home.  Pt has PACE.  Per chart, pt has SOB in the past 3 days, which has been progressively worsening.   Per IMaging in chart:  CXR this admit- Collapse or consolidation in the left lower lung with small left pleural effusion. Chronic interstitial changes in the lungs, likely  fibrosis.  Pt has only had 2 other CXRs since 2018 which were clear or w/ atelectasis. Type of Study: Bedside Swallow Evaluation Previous Swallow Assessment: none Diet Prior to this Study: NPO Temperature Spikes Noted: No (wbc 18.9 trending down) Respiratory Status: Nasal cannula (4L) History of Recent Intubation: No (weaned from BIPAP) Behavior/Cognition: Alert;Cooperative;Pleasant mood;Confused;Distractible;Requires cueing (baseline Dementia) Oral Cavity Assessment: Within Functional Limits Oral Care Completed by SLP: Recent completion by staff Oral Cavity - Dentition: Dentures, top (edentulous on bottom) Vision: Functional for self-feeding Self-Feeding Abilities: Able to feed self;Needs assist;Needs set up;Total assist (able  to hold Cup to drink) Patient Positioning: Upright in bed (max assist for sitting fully upright in bed) Baseline Vocal Quality: Normal;Low vocal intensity Volitional Cough: Strong Volitional Swallow: Able to elicit    Oral/Motor/Sensory Function Overall Oral Motor/Sensory Function: Within functional limits   Ice Chips Ice chips: Within functional limits Presentation: Spoon (fed; 2 trials)   Thin Liquid Thin Liquid: Within functional limits Presentation: Self Fed;Straw (uses straw baseline: ~5-6 ozs total) Other Comments: his soda, water    Nectar Thick Nectar Thick Liquid: Not tested   Honey Thick Honey Thick Liquid: Not tested   Puree Puree: Within functional limits Presentation: Spoon (fed; 10+ trials) Other Comments: w/ Pills in applesauce also by NSG   Solid     Solid: Not tested Other Comments: Wife is eager to use the PUreed diet consistency b/c of how "well" the pt did w/ it at lunch meal today        Darla Edward, MS, CCC-SLP Speech Language Pathologist Rehab Services; [facility] [place] [phone] (ascom) Bonetta Mostek 12/04/2023,5:43 PM

## 2023-12-04 NOTE — Progress Notes (Signed)
 PHARMACY - PHYSICIAN COMMUNICATION CRITICAL VALUE ALERT - BLOOD CULTURE IDENTIFICATION (BCID)  Cory Moses is an 63 y.o. male who presented to [facility] on 12/03/2023 with a chief complaint of shortness of breath  Assessment:  blood cultures from 6/2 with GPC in 1 of 4 bottles BCID detects MRSE.  On antibiotics for aspiration pneumonia.   Name of physician (or Provider) Contacted: Dr Eveleen Hinds  Current antibiotics: ampicillin/sulbactam,  doxycycline.   Changes to prescribed antibiotics recommended:  - recheck blood culture and monitor on current therapy.    Results for orders placed or performed during the hospital encounter of 12/03/23  Blood Culture ID Panel (Reflexed) (Collected: 12/03/2023  4:48 PM)  Result Value Ref Range   Enterococcus faecalis NOT DETECTED NOT DETECTED   Enterococcus Faecium NOT DETECTED NOT DETECTED   Listeria monocytogenes NOT DETECTED NOT DETECTED   Staphylococcus species DETECTED (A) NOT DETECTED   Staphylococcus aureus (BCID) NOT DETECTED NOT DETECTED   Staphylococcus epidermidis DETECTED (A) NOT DETECTED   Staphylococcus lugdunensis NOT DETECTED NOT DETECTED   Streptococcus species NOT DETECTED NOT DETECTED   Streptococcus agalactiae NOT DETECTED NOT DETECTED   Streptococcus pneumoniae NOT DETECTED NOT DETECTED   Streptococcus pyogenes NOT DETECTED NOT DETECTED   A.calcoaceticus-baumannii NOT DETECTED NOT DETECTED   Bacteroides fragilis NOT DETECTED NOT DETECTED   Enterobacterales NOT DETECTED NOT DETECTED   Enterobacter cloacae complex NOT DETECTED NOT DETECTED   Escherichia coli NOT DETECTED NOT DETECTED   Klebsiella aerogenes NOT DETECTED NOT DETECTED   Klebsiella oxytoca NOT DETECTED NOT DETECTED   Klebsiella pneumoniae NOT DETECTED NOT DETECTED   Proteus species NOT DETECTED NOT DETECTED   Salmonella species NOT DETECTED NOT DETECTED   Serratia marcescens NOT DETECTED NOT DETECTED   Haemophilus influenzae NOT DETECTED NOT DETECTED    Neisseria meningitidis NOT DETECTED NOT DETECTED   Pseudomonas aeruginosa NOT DETECTED NOT DETECTED   Stenotrophomonas maltophilia NOT DETECTED NOT DETECTED   Candida albicans NOT DETECTED NOT DETECTED   Candida auris NOT DETECTED NOT DETECTED   Candida glabrata NOT DETECTED NOT DETECTED   Candida krusei NOT DETECTED NOT DETECTED   Candida parapsilosis NOT DETECTED NOT DETECTED   Candida tropicalis NOT DETECTED NOT DETECTED   Cryptococcus neoformans/gattii NOT DETECTED NOT DETECTED   Methicillin resistance mecA/C DETECTED (A) NOT DETECTED   Valma Gazella, PharmD, BCPS, BCIDP Work Cell: [phone] 12/04/2023 2:41 PM

## 2023-12-04 NOTE — Consult Note (Addendum)
 WOC Nurse Consult Note: patient with known history of PAD; has had angiogram by vascular to L leg 11/2023  Reason for Consult: leg and foot ulcers  Wound type: full thickness legs and R heel d/t PAD  Pressure Injury POA: NA, not caused by pressure  Measurement: see nursing flowsheet  Wound bed: 1.  L lateral leg and R heel black eschar  2.  L medial lower leg wound pink and moist; R lateral leg 50% pink moist 50% tan fibrinous  Drainage (amount, consistency, odor) per nursing flowsheet  Periwound: Dressing procedure/placement/frequency:   Cleanse R heel and L lateral leg wounds (eschar) with Vashe wound cleanser [employer] [phone]) do not rinse and allow to air dry. Apply Xeroform gauze to areas of eschar daily [employer] [phone]), secure with dry gauze and silicone foam or Kerlix roll gauze.  Place R heel in Prevalon boot to offload pressure [employer] [phone]) Cleanse open wounds L medial and R lateral leg with Vashe, apply Vashe moistened gauze to wound beds daily, cover with dry gauze and silicone foam or ABD pad and Kerlix roll gauze whichever is preferred.  POC discussed with bedside nurse. WOC team will not follow.  Re-consult if further needs arise.   Per notes it appears patient is followed by home health care for ongoing care of wounds and should continue to do so at discharge.    Thank you,    Ronni Colace MSN, RN-BC, [employer] [phone]

## 2023-12-04 NOTE — Hospital Course (Signed)
 Cory Moses is a 63 y.o. 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  Per EMS report, patient was found to have severe respiratory distress, oxygen desaturation to 70s, CPAP was started by EMS, which was changed to BiPAP in the ED due to severe respiratory distress.  Upon arrival in the hospital, heart rate was 115, severe leukocytosis of 20.4, lactic acidosis 3.0.  Chest x-ray showed left lower lung consolidation.  He is treated with Rocephin and Zithromax.

## 2023-12-04 NOTE — Progress Notes (Signed)
 Weaned patient from BIPAP, placed on 4Lnc, tolerating at this time

## 2023-12-04 NOTE — Progress Notes (Addendum)
 Progress Note   Patient: Cory Moses FAO:[id number] DOB: [birth year] DOA: 12/03/2023     1 DOS: the patient was seen and examined on 12/04/2023   Brief hospital course: Cory Moses is a 63 y.o. 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  Per EMS report, patient was found to have severe respiratory distress, oxygen desaturation to 70s, CPAP was started by EMS, which was changed to BiPAP in the ED due to severe respiratory distress.  Upon arrival in the hospital, heart rate was 115, severe leukocytosis of 20.4, lactic acidosis 3.0.  Chest x-ray showed left lower lung consolidation.  He is treated with Rocephin and Zithromax.   Principal Problem:   CAP (community acquired pneumonia) Active Problems:   Acute on chronic respiratory failure with hypoxia (HCC)   Severe sepsis (HCC)   COPD (chronic obstructive pulmonary disease) (HCC)   HTN (hypertension)   HLD (hyperlipidemia)   Factor V Leiden mutation (HCC)   Prolonged QT interval   BPH (benign prostatic hyperplasia)   Chronic pain syndrome   PAD (peripheral artery disease) (HCC)   Depression with anxiety   Ulcers of both lower legs (HCC)   Assessment and Plan: Severe sepsis secondary to pneumonia. Acute on chronic hypoxemic respite failure secondary to pneumonia Left lower lobe pneumonia.  Most likely aspiration pneumonia. Dysphagia. Patient had a severe respite distress at time of admission required BiPAP.  Currently on 4 L oxygen, baseline on 2 L oxygen. This is secondary to left lower lobe pneumonia. Patient has COPD, does not seem to have any bronchospasm. Met patient wife, patient has significant dysphagia with frequent choking.  Prior to admission, patient had another choking  episode.  Will change antibiotic to Unasyn.  Changed diet to dysphagia 1 diet, obtain speech therapy evaluation, patient most likely will need modified barium swallow.  1/4 blood culture methicillin resistant staph epi, could be contaminant. Reculture.  Peripheral arterial disease. Multiple ulcers in the lower extremities. Right heel deep wound. Patient has multiple ulcers in the lower extremities, this appears to be secondary to ischemia.   Discussed with the patient wife, patient had a bilateral lower extremity angiogram and stents placed 2 weeks ago, the left leg ulceration has been better, since then, but the right side ulcerations getting worse again.  Obtain vascular consult again.  Severe debility. Chronic back pain. Patient has chronic back pain, had a prior back surgery more than 10 years ago.  He has significant leg weakness, has been bedbound for 18 months. Obtain MRI of the lumbar spine to rule out spinal stenosis. PT/OT evaluation and treat. Patient appeared to have a large bladder at exam, obtaining bladder scan, may need Foley catheter if residuals higher than 300 mL.   COPD. No bronchospasm.  Chronic pain syndrome on methadone .  Essential hypertension. Blood pressure medicine on hold due to severe sepsis.  May restart tomorrow if her condition improves.  Factor V Leiden mutation. Continue Eliquis .  Severe constipation. Patient had severe constipation, probably from opioids.  Started on lactulose.    Subjective:  Patient is quite sleepy, still short of breath with exertion.  Cough, with some yellow mucus.  Physical Exam: Vitals:   12/04/23 0127 12/04/23 0423 12/04/23 0732 12/04/23 1031  BP:  134/71 124/60 129/64  Pulse:   (!) 52 79  Resp:  16 18 17   Temp:  97.8 F (36.6 C) 97.6 F (36.4 C) 97.8 F (36.6 C)  TempSrc:  Oral    SpO2: 96% 94% 98% 91%  Weight:      Height:       General exam: Appears calm and comfortable  Respiratory system: Decreased  breath sounds. Respiratory effort normal. Cardiovascular system: S1 & S2 heard, RRR. No JVD, murmurs, rubs, gallops or clicks. No pedal edema. Gastrointestinal system: Abdomen is nondistended, soft and nontender. No organomegaly or masses felt. Normal bowel sounds heard. Central nervous system: Drowsy and oriented x3. No focal neurological deficits. Extremities: Multiple ulcers in the bilateral lower extremities, Skin: as above Psychiatry: Flat affect.   Data Reviewed:  Reviewed chest x-ray results and image, lab results.  Family Communication: Wife updated at bedside.  Disposition: Status is: Inpatient Remains inpatient appropriate because: Severity of disease, IV treatment.     Time spent: 60 minutes  Author: Donaciano Frizzle, MD 12/04/2023 12:32 PM  For on call review [email or web address].

## 2023-12-04 NOTE — TOC Initial Note (Signed)
 Transition of Care [facility]) - Initial/Assessment Note    Patient Details  Name: Cory Moses MRN: [id number] Date of Birth: 1961-05-01  Transition of Care [facility]) CM/SW Contact:    Odilia Bennett, LCSW Phone Number: 12/04/2023, 1:29 PM  Clinical Narrative:   Readmission prevention screen complete. CSW met with patient. Wife and another male family/friend at bedside. CSW introduced role and explained that discharge planning would be discussed. [facility] is a [facility] participate. His PCP there is Dr. Vanhorn. His social worker is Moira Andrews and his RNCM is Robin. [facility] the [facility] pharmacy. Patient lives home with his wife. He has a CNA that comes for 3 hours Monday-Friday and two hours on Saturdays. [facility] to [facility] two weeks for wound care and once per month for labs. He has a Nurse, adult, wheelchair, hospital bed, nebulizer, oxygen (2 L), and all needed supplies for his care (chux, gloves, etc). No further concerns. CSW will continue to follow patient for support and facilitate return home once stable. Wife said he will likely need ambulance transport home. Address on facesheet is correct. Per Moira Andrews at [facility], can see how well he is moving at discharge before determining method of transportation.                Expected Discharge Plan: Home/Self Care (with aide services) Barriers to Discharge: Continued Medical Work up   Patient Goals and CMS Choice            Expected Discharge Plan and Services     Post Acute Care Choice: Resumption of Svcs/PTA Provider Living arrangements for the past 2 months: Single Family Home                                      Prior Living Arrangements/Services Living arrangements for the past 2 months: Single Family Home Lives with:: Spouse Patient language and need for interpreter reviewed:: Yes Do you feel safe going back to the place where you live?: Yes      Need for Family Participation in Patient Care: Yes (Comment) Care giver  support system in place?: Yes (comment) Current home services: DME, Homehealth aide Criminal Activity/Legal Involvement Pertinent to Current Situation/Hospitalization: No - Comment as needed  Activities of Daily Living   ADL Screening (condition at time of admission) Independently performs ADLs?: No Does the patient have a NEW difficulty with bathing/dressing/toileting/self-feeding that is expected to last >3 days?: No Does the patient have a NEW difficulty with getting in/out of bed, walking, or climbing stairs that is expected to last >3 days?: No Does the patient have a NEW difficulty with communication that is expected to last >3 days?: No Is the patient deaf or have difficulty hearing?: No Does the patient have difficulty seeing, even when wearing glasses/contacts?: No Does the patient have difficulty concentrating, remembering, or making decisions?: No  Permission Sought/Granted Permission sought to share information with : Facility Medical sales representative, Family Supports    Share Information with NAME: [name]  Permission granted to share info w AGENCY: [facility]  Permission granted to share info w Relationship: Wife  Permission granted to share info w Contact Information: [phone]  Emotional Assessment Appearance:: Appears stated age Attitude/Demeanor/Rapport: Engaged Affect (typically observed): Appropriate, Calm, Pleasant Orientation: : Oriented to Self, Oriented to Place, Oriented to  Time, Oriented to Situation Alcohol / Substance Use: Not Applicable Psych Involvement: No (comment)  Admission diagnosis:  CAP (community acquired pneumonia) [J18.9] Pneumonia of left lower lobe due to infectious organism [J18.9] Sepsis, due to unspecified organism, unspecified whether acute organ dysfunction present (HCC) [A41.9] Acute hypoxic respiratory failure (HCC) [J96.01] Patient Active Problem List   Diagnosis Date Noted   Chronic pain syndrome 12/04/2023   Ulcers of both  lower legs (HCC) 12/04/2023   Severe sepsis (HCC) 12/04/2023   CAP (community acquired pneumonia) 12/03/2023   Acute on chronic respiratory failure with hypoxia (HCC) 12/03/2023   Prolonged QT interval 12/03/2023   BPH (benign prostatic hyperplasia) 12/03/2023   Factor V Leiden mutation (HCC)    PAD (peripheral artery disease) (HCC)    HTN (hypertension)    HLD (hyperlipidemia)    Depression with anxiety    Atherosclerosis of native arteries of the extremities with ulceration (HCC) 10/30/2023   Heel pain 10/17/2022   BPH with urinary obstruction 10/25/2021   Incomplete emptying of bladder 10/25/2021   Nursing home resident 10/25/2021   Obstructive uropathy 10/25/2021   Chronic prescription benzodiazepine use 01/06/2021   Vascular dementia (HCC) 12/28/2020   Major neurocognitive disorder (HCC) 12/22/2020   Anxiety 11/24/2020   Osteoporosis 09/14/2019   Closed fracture of right hip (HCC) 08/12/2019   Essential hypertension 06/07/2019   Pseudophakia of right eye 04/29/2018   Chronic pain due to trauma 01/24/2018   Factor 5 Leiden mutation, heterozygous (HCC) 01/24/2018   Chronic anticoagulation 12/10/2017   COPD (chronic obstructive pulmonary disease) (HCC) 12/10/2017   Long-term current use of methadone  for opiate dependence (HCC) 12/10/2017   Protein-calorie malnutrition (HCC) 12/10/2017   Seizure (HCC) 12/09/2017   Withdrawal from benzodiazepine (HCC) 12/09/2017   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   Lumbar compression fracture (HCC) 02/13/2014   Thoracic back pain 02/13/2014   Thoracic compression fracture (HCC) 02/13/2014   PCP:  System, Provider Not In Pharmacy:   [facility] [place], [place] - [address] [place] [address] Phone: [phone] Fax: [phone]     Social Drivers of Health (SDOH) Social History: SDOH Screenings   Food Insecurity: Patient Unable To Answer (12/03/2023)  Housing: Patient Unable To  Answer (12/03/2023)  Transportation Needs: No Transportation Needs (12/03/2023)  Utilities: Not At Risk (12/03/2023)  Financial Resource Strain: Low Risk  (12/08/2020)   Received from [facility], [facility]  Social Connections: Unknown (12/03/2023)  Tobacco Use: Medium Risk (11/16/2023)  Health Literacy: Medium Risk (10/24/2021)   Received from [facility], [facility]   SDOH Interventions:     Readmission Risk Interventions    12/04/2023   12:47 PM  Readmission Risk Prevention Plan  Transportation Screening Complete  Medication Review (RN Care Manager) Complete  PCP or Specialist appointment within 3-5 days of discharge Complete  HRI or Home Care Consult Complete  SW Recovery Care/Counseling Consult Complete  Palliative Care Screening Not Applicable  Skilled Nursing Facility Not Applicable

## 2023-12-04 NOTE — Consult Note (Signed)
 Hospital Consult    Reason for Consult:  Bilateral Lower extremity Leg Ulcers Requesting Physician:  Dr Donaciano Frizzle MD  MRN #:  [id number]  History of Present Illness: This is a 63 y.o. 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  Patient was last seen by Dr. Mikki Alexander on 11/12/2023 where he underwent an aortogram with selective bilateral lower extremity angiograms.  He had percutaneous transluminal angioplasty of the left common femoral artery and distal external iliac artery with Lifestream stents placed at that time.  Postprocedure the patient had good blood flow to his distal bilateral lower extremities.   Today at the bedside patient's wife is concerned about the wound to his right lower extremity.  Nursing that comes to the home that was evaluating said that the wound was getting a lot worse and not healing well.  He also had a wound to his left lower extremity which is well-healed at this point time post procedure.  Patient's wife states that the day after the procedure he came back to the emergency room and was given steroids with a steroid Dosepak for a reaction to either the dye from the procedure or some type of antibiotic.  She did not realize it but this may have made his sugar spike and therefore could be contributing to his right lower extremity wound not healing well.  This is the only change in medications he has had since the procedure.  Vascular surgery consulted to evaluate.  Past Medical History:  Diagnosis Date   COPD (chronic obstructive pulmonary disease) (HCC)    Depression with anxiety    Factor V Leiden mutation (HCC)    HLD (hyperlipidemia)    HTN (hypertension)    PAD (peripheral artery disease) (HCC)    Seizures (HCC)    Vascular  dementia (HCC)     Past Surgical History:  Procedure Laterality Date   BACK SURGERY     LEG SURGERY Bilateral    legs was broken   LOWER EXTREMITY ANGIOGRAPHY Left 11/12/2023   Procedure: Lower Extremity Angiography;  Surgeon: Celso College, MD;  Location: [facility] INVASIVE CV LAB;  Service: Cardiovascular;  Laterality: Left;    Allergies  Allergen Reactions   Lyrica [Pregabalin]    Morphine Other (See Comments)    Other reaction(s): Other (See Comments), Other (See Comments)  Doesn't like Morphine, mental status changes.  Doesn't like Morphine  Doesn't like Morphine, mental status changes.    Doesn't like Morphine    Other reaction(s): Other (See Comments), Other (See Comments) Doesn't like Morphine, mental status changes. Doesn't like Morphine  Other Reaction(s): Other (See Comments)  Doesn't like Morphine, mental status changes.    Doesn't like Morphine    Other reaction(s): Other (See Comments), Other (See Comments) Doesn't like Morphine, mental status changes. Doesn't like Morphine    Other reaction(s): Other (See Comments), Other (See Comments)  Doesn't like Morphine, mental status changes.  Doesn't like Morphine  Doesn't like Morphine, mental status changes.  Doesn't like Morphine  Other reaction(s): Other (See Comments), Other (See Comments) Doesn't like Morphine, mental status changes. Doesn't like Morphine   Morphine And Codeine Anxiety    Prior to Admission medications   Medication Sig Start Date End Date Taking? Authorizing Provider  albuterol (PROVENTIL HFA;VENTOLIN HFA) 108 (90 Base) MCG/ACT inhaler  Inhale 2 puffs into the lungs every 4 (four) hours as needed for wheezing or shortness of breath.   Yes [provider]  alfuzosin  (UROXATRAL ) 10 MG 24 hr tablet Take 1 tablet (10 mg total) by mouth daily with breakfast. 08/31/23  Yes Lawerence Pressman, MD  amLODipine (NORVASC) 10 MG tablet Take 10 mg by mouth daily. 12/22/21  Yes [provider]   apixaban  (ELIQUIS ) 5 MG TABS tablet Take 5 mg by mouth 2 (two) times daily.   Yes [provider]  Ascorbic Acid (VITAMIN C) 250 MG CHEW Chew 1 tablet by mouth daily. 06/02/22  Yes [provider]  ASPERCREME LIDOCAINE  4 % CREA Apply topically. 08/17/23  Yes [provider]  aspirin  EC 81 MG tablet Take 1 tablet (81 mg total) by mouth daily. Swallow whole. 11/12/23 11/11/24 Yes Dew, Donald Frost, MD  atorvastatin  (LIPITOR) 10 MG tablet Take 1 tablet (10 mg total) by mouth daily. 11/12/23 11/11/24 Yes Dew, Donald Frost, MD  BEN GAY ULTRA STRENGTH 10-10-28 % CREA Apply topically. 07/26/23  Yes [provider]  bisacodyl (DULCOLAX) 10 MG suppository Place 10 mg rectally 2 (two) times a week. Monday and Thursday 08/04/23  Yes [provider]  Calcium  Carb-Cholecalciferol 600-10 MG-MCG TABS Take 1 tablet by mouth 2 (two) times daily. 06/02/22  Yes [provider]  cetirizine (ZYRTEC) 10 MG tablet Take 10 mg by mouth daily. 11/01/23  Yes [provider]  clonazePAM  (KLONOPIN ) 0.5 MG tablet Take 0.5-1 mg by mouth 3 (three) times daily. 12/24/21  Yes [provider]  dutasteride  (AVODART ) 0.5 MG capsule Take 1 capsule (0.5 mg total) by mouth daily. 08/29/23  Yes Lawerence Pressman, MD  esomeprazole (NEXIUM) 40 MG capsule Take 40 mg by mouth daily.   Yes [provider]  FLUoxetine (PROZAC) 40 MG capsule Take 40 mg by mouth daily. 08/04/23  Yes [provider]  fluticasone (FLONASE) 50 MCG/ACT nasal spray Place into both nostrils. 06/28/23  Yes [provider]  gabapentin  (NEURONTIN ) 600 MG tablet Take 300 mg by mouth 2 (two) times daily. 08/01/23  Yes [provider]  GAS RELIEF EXTRA STRENGTH 125 MG chewable tablet Chew 125 mg by mouth every 6 (six) hours as needed for flatulence. 08/03/23  Yes [provider]  GERI-TUSSIN 100 MG/5ML liquid Take 10 mLs by mouth every 4 (four) hours as needed for cough. 08/24/23  Yes  [provider]  Hydrocortisone Acetate 1 % CREA Apply 1 Application topically 2 (two) times daily as needed. 08/09/23  Yes [provider]  hydrocortisone cream 1 % Apply topically. 08/09/23  Yes [provider]  lactulose (CHRONULAC) 10 GM/15ML solution Take 20 g by mouth daily. 06/02/22  Yes [provider]  lisinopril  (ZESTRIL ) 2.5 MG tablet Take 2.5 mg by mouth daily. 12/22/21  Yes [provider]  loperamide (IMODIUM) 2 MG capsule Take 2 mg by mouth 2 (two) times daily. 07/16/23  Yes [provider]  lubiprostone (AMITIZA) 8 MCG capsule Take 8 mcg by mouth 2 (two) times daily with a meal. 08/04/23  Yes [provider]  methadone  (DOLOPHINE ) 10 MG tablet Take 10 mg by mouth 3 (three) times daily. 12/22/21  Yes [provider]  mirtazapine (REMERON) 7.5 MG tablet Take 7.5 mg by mouth at bedtime. 08/04/23  Yes [provider]  nystatin (MYCOSTATIN/NYSTOP) powder Apply 1 Application topically 2 (two) times daily. 08/09/23  Yes [provider]  ondansetron  (ZOFRAN ) 8 MG tablet  Take 8 mg by mouth 3 (three) times daily. 09/17/23  Yes [provider]  polyethylene glycol powder (GLYCOLAX/MIRALAX) 17 GM/SCOOP powder Take 17 g by mouth daily. 08/04/23  Yes [provider]  risperiDONE (RISPERDAL) 0.5 MG tablet Take 0.5 mg by mouth at bedtime. Take along with 1 mg (one tablet) for total 1.5 mg at bedtime only 11/01/23  Yes [provider]  risperiDONE (RISPERDAL) 1 MG tablet Take 1-1.5 mg by mouth 2 (two) times daily. Take 1 mg (one tablet) every morning and take along with 0.5 mg (one tablet) for total 1.5 mg at bedtime only 12/22/21  Yes [provider]  SANTYL 250 UNIT/GM ointment Apply 1 Application topically daily. 11/08/23  Yes [provider]  senna-docusate (SENOKOT-S) 8.6-50 MG tablet Take 2 tablets by mouth 2 (two) times daily.   Yes [provider]  Testosterone 1.62 % GEL  Apply 4 Pump topically daily. Apply two pumps to each shoulder/upper arm once daily 11/01/23  Yes [provider]  thiamine 100 MG tablet Take 100 mg by mouth daily. 08/04/23  Yes [provider]  TRELEGY ELLIPTA 100-62.5-25 MCG/ACT AEPB Inhale 1 puff into the lungs daily. 11/01/23  Yes [provider]  Budeson-Glycopyrrol-Formoterol (BREZTRI AEROSPHERE) 160-9-4.8 MCG/ACT AERO Inhale into the lungs. Patient not taking: Reported on 12/03/2023 01/08/23 01/08/24  [provider]  Ensure (ENSURE) Take by mouth. 08/20/23   [provider]  melatonin 3 MG TABS tablet Take by mouth. Patient not taking: Reported on 12/03/2023 08/24/21   [provider]  OXYGEN 2 L at bedtime.    [provider]  predniSONE  (STERAPRED UNI-PAK 21 TAB) 10 MG (21) TBPK tablet As directed on packaging Patient not taking: Reported on 12/03/2023 11/16/23   Charleen Conn, MD    Social History   Socioeconomic History   Marital status: Married    Spouse name: Not on file   Number of children: Not on file   Years of education: Not on file   Highest education level: Not on file  Occupational History   Not on file  Tobacco Use   Smoking status: Former    Types: Cigarettes    Passive exposure: Current   Smokeless tobacco: Never  Vaping Use   Vaping status: Never Used  Substance and Sexual Activity   Alcohol use: No   Drug use: No   Sexual activity: Never  Other Topics Concern   Not on file  Social History Narrative   Not on file   Social Drivers of Health   Financial Resource Strain: Low Risk  (12/08/2020)   Received from [facility], [facility]   Overall Financial Resource Strain (CARDIA)    Difficulty of Paying Living Expenses: Not very hard  Food Insecurity: Patient Unable To Answer (12/03/2023)   Hunger Vital Sign    Worried About Running Out of Food in the Last Year: Patient unable to answer    Ran Out of Food in the Last Year: Patient unable to answer   Transportation Needs: No Transportation Needs (12/03/2023)   PRAPARE - Administrator, Civil Service (Medical): No    Lack of Transportation (Non-Medical): No  Physical Activity: Not on file  Stress: Not on file  Social Connections: Unknown (12/03/2023)   Social Connection and Isolation Panel [NHANES]    Frequency of Communication with Friends and Family: Patient unable to answer    Frequency of Social Gatherings with Friends and Family: Patient unable to  answer    Attends Religious Services: Patient unable to answer    Active Member of Clubs or Organizations: Patient declined    Attends Banker Meetings: Patient unable to answer    Marital Status: Not on file  Intimate Partner Violence: Patient Unable To Answer (12/03/2023)   Humiliation, Afraid, Rape, and Kick questionnaire    Fear of Current or Ex-Partner: Patient unable to answer    Emotionally Abused: Patient unable to answer    Physically Abused: Patient unable to answer    Sexually Abused: Patient unable to answer     Family History  Problem Relation Age of Onset   Stroke Mother    Peripheral Artery Disease Father     ROS: Otherwise negative unless mentioned in HPI  Physical Examination  Vitals:   12/04/23 0732 12/04/23 1031  BP: 124/60 129/64  Pulse: (!) 52 79  Resp: 18 17  Temp: 97.6 F (36.4 C) 97.8 F (36.6 C)  SpO2: 98% 91%   Body mass index is 18.88 kg/m.  General:  WDWN in NAD Gait: Not observed HENT: WNL, normocephalic Pulmonary: normal non-labored breathing, without Rales, rhonchi,  wheezing Cardiac: regular, without  Murmurs, rubs or gallops; without carotid bruits Abdomen: Positive bowel sounds throughout.  soft, NT/ND, no masses Skin: without rashes Vascular Exam/Pulses: Patient has palpable pulses throughout.  Bilateral lower extremity pulses were checked with Dopplers and they were strong.  Bilateral lower extremities are warm to touch. Extremities: without ischemic  changes, without Gangrene , without cellulitis; with open wounds;  Musculoskeletal: no muscle wasting or atrophy  Neurologic: A&O X 3;  No focal weakness or paresthesias are detected; speech is fluent/normal Psychiatric:  The pt has Normal affect. Lymph:  Unremarkable  CBC    Component Value Date/Time   WBC 18.9 (H) 12/04/2023 0535   RBC 3.23 (L) 12/04/2023 0535   HGB 8.8 (L) 12/04/2023 0535   HCT 28.2 (L) 12/04/2023 0535   PLT 303 12/04/2023 0535   MCV 87.3 12/04/2023 0535   MCH 27.2 12/04/2023 0535   MCHC 31.2 12/04/2023 0535   RDW 14.9 12/04/2023 0535   LYMPHSABS 4.1 (H) 12/03/2023 1646   MONOABS 1.1 (H) 12/03/2023 1646   EOSABS 0.2 12/03/2023 1646   BASOSABS 0.2 (H) 12/03/2023 1646    BMET    Component Value Date/Time   NA 142 12/04/2023 0535   K 4.2 12/04/2023 0535   CL 107 12/04/2023 0535   CO2 25 12/04/2023 0535   GLUCOSE 199 (H) 12/04/2023 0535   BUN 24 (H) 12/04/2023 0535   CREATININE 0.91 12/04/2023 0535   CALCIUM  7.9 (L) 12/04/2023 0535   GFRNONAA >60 12/04/2023 0535   GFRAA >60 01/02/2017 1610    COAGS: Lab Results  Component Value Date   INR 1.8 (H) 12/03/2023     Non-Invasive Vascular Imaging:   ABIs of bilateral lower extremities ordered today.  No results at this time.  Statin:  Yes.   Beta Blocker:  No. Aspirin :  No. ACEI:  Yes.   ARB:  No. CCB use:  Yes Other antiplatelets/anticoagulants:  Yes.   Eliquis  5 mg BID   ASSESSMENT/PLAN: This is a 63 y.o. male with a medical history significant for factor V Leiden mutation on Eliquis , hypertension, hyperlipidemia, COPD on 2 L nasal cannula oxygen at home, PVD, chronic leg ulcers, GERD, vascular dementia with depression and anxiety, BPH, peripheral neuropathy with chronic pain on methadone  who presents to [facility] emergency department with shortness of breath.  Upon workup  patient was noted to have what appears to be an aspiration pneumonia contributing to his shortness of breath.  Vascular surgery  was consulted due to the bilateral lower extremity wounds.  On 11/12/2023 patient underwent bilateral lower extremity angiograms with angioplasty and stent placement to his iliac arteries.  He had no disease below this in either leg.  Wife at the bedside is concerned because the wound to his left leg is healed well with the wound to his right leg has not.  Wife states that the patient was taken back to the emergency room the day after the procedure due to significant red rash all over his body.  He was given steroids and put on a steroid taper due to what was believed to be a reaction to either the contrast dye or possible antibiotics.  She states that she has not been checking his blood sugars but today in the hospital they still remained high at 236 while he has not been eating.  This may be contributing to his wound in his right lower extremity not healing well.  Therefore I have asked the hospitalist to order bilateral lower extremity ABIs as he has good strong Doppler pulses bilaterally today and his feet are both warm.  I do not believe this is a vascular problem but if his ABI show otherwise we may have to repeat angiogram.  Vascular surgery will follow-up after ABIs are completed.   -I discussed the plan in detail with Dr. Devon Fogo diabetes with the plan.   Annamaria Barrette [facility] 12/04/2023 12:51 PM

## 2023-12-05 DIAGNOSIS — J9621 Acute and chronic respiratory failure with hypoxia: Secondary | ICD-10-CM

## 2023-12-05 LAB — CBC
HCT: 27.1 % — ABNORMAL LOW (ref 39.0–52.0)
Hemoglobin: 8 g/dL — ABNORMAL LOW (ref 13.0–17.0)
MCH: 26.9 pg (ref 26.0–34.0)
MCHC: 29.5 g/dL — ABNORMAL LOW (ref 30.0–36.0)
MCV: 91.2 fL (ref 80.0–100.0)
Platelets: 287 10*3/uL (ref 150–400)
RBC: 2.97 MIL/uL — ABNORMAL LOW (ref 4.22–5.81)
RDW: 15.3 % (ref 11.5–15.5)
WBC: 14.4 10*3/uL — ABNORMAL HIGH (ref 4.0–10.5)
nRBC: 0 % (ref 0.0–0.2)

## 2023-12-05 LAB — BASIC METABOLIC PANEL WITH GFR
Anion gap: 8 (ref 5–15)
BUN: 25 mg/dL — ABNORMAL HIGH (ref 8–23)
CO2: 25 mmol/L (ref 22–32)
Calcium: 8.1 mg/dL — ABNORMAL LOW (ref 8.9–10.3)
Chloride: 108 mmol/L (ref 98–111)
Creatinine, Ser: 0.89 mg/dL (ref 0.61–1.24)
GFR, Estimated: >60 mL/min (ref >60)
Glucose, Bld: 97 mg/dL (ref 70–99)
Potassium: 3.8 mmol/L (ref 3.5–5.1)
Sodium: 141 mmol/L (ref 135–145)

## 2023-12-05 LAB — MAGNESIUM: Magnesium: 2.2 mg/dL (ref 1.7–2.4)

## 2023-12-05 MED ORDER — FLUOXETINE HCL 20 MG PO CAPS
40.0000 mg | ORAL_CAPSULE | Freq: Every day | ORAL | Status: DC
  Administered 2023-12-06 – 2023-12-07 (×2): 40 mg via ORAL
  Filled 2023-12-05 (×2): qty 2

## 2023-12-05 MED ORDER — SACCHAROMYCES BOULARDII 250 MG PO CAPS
250.0000 mg | ORAL_CAPSULE | Freq: Two times a day (BID) | ORAL | Status: DC
  Administered 2023-12-05 – 2023-12-07 (×5): 250 mg via ORAL
  Filled 2023-12-05 (×5): qty 1

## 2023-12-05 MED ORDER — BUDESON-GLYCOPYRROL-FORMOTEROL 160-9-4.8 MCG/ACT IN AERO
2.0000 | INHALATION_SPRAY | Freq: Two times a day (BID) | RESPIRATORY_TRACT | Status: DC
  Administered 2023-12-05 – 2023-12-07 (×4): 2 via RESPIRATORY_TRACT
  Filled 2023-12-05: qty 5.9

## 2023-12-05 NOTE — Progress Notes (Signed)
 PROGRESS NOTE    Cory Moses  MVH:[id number] DOB: 12-Nov-1960 DOA: 12/03/2023 PCP: System, Provider Not In  256A/256A-AA  LOS: 2 days   Brief hospital course:   Assessment & Plan: Cory Moses is a 63 y.o. 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  Per EMS report, patient was found to have severe respiratory distress, oxygen desaturation to 70s, CPAP was started by EMS, which was changed to BiPAP in the ED due to severe respiratory distress.    Severe sepsis secondary to pneumonia.  Acute on chronic hypoxemic respiratory failure secondary to aspiration pneumonia Patient had a severe respite distress at time of admission required BiPAP.  Currently on 4 L oxygen, baseline on 2 L oxygen. This is secondary to left lower lobe pneumonia. Patient has COPD, does not seem to have any bronchospasm. Plan: --tx PNA --Continue supplemental O2 to keep sats >=90%, wean as tolerated  Left lower lobe pneumonia most likely aspiration pneumonia. Dysphagia. --per wife, patient has significant dysphagia with frequent choking.  Prior to admission, patient had another choking episode.   --abx changed to Unasyn --cont Unasyn --SLP   1/4 blood culture methicillin resistant staph epi, could be contaminant. Reculture.   Peripheral arterial disease. Multiple ulcers in the lower extremities. Right heel deep wound. Patient has multiple ulcers in the lower extremities, this appears to be secondary to ischemia.   Discussed with the patient wife, patient had a bilateral lower extremity angiogram and stents placed 2 weeks ago, the left leg ulceration has been better, since then, but the right side ulcerations getting worse again.   --vascular consulted    Severe debility. Chronic back pain. Patient has chronic back pain, had a prior back surgery more than 10 years ago.  He has significant leg weakness, has been bedbound for 18 months. MRI of the lumbar spine neg for spinal stenosis.   COPD. No bronchospasm. --cont home bronchodilators   Chronic pain syndrome on methadone . --cont home methadone    Essential hypertension. --BP wnl without antihypertensives   Factor V Leiden mutation. Continue Eliquis .   Severe constipation. --per RN, started to have loose stools. --d/c scheduled senna and Miralax   DVT prophylaxis: On:Eliquis  Code Status: Full code  Family Communication:  Level of care: Progressive Dispo:   The patient is from: home Anticipated d/c is to: home Anticipated d/c date is: 2-3 days   Subjective and Interval History:  Pt denied dyspnea.   Objective: Vitals:   12/05/23 0335 12/05/23 0813 12/05/23 1112 12/05/23 1712  BP: 116/62  130/67 133/75  Pulse: (!) 57  75 73  Resp: 18  18   Temp: 97.8 F (36.6 C)  98.8 F (37.1 C) 98.5 F (36.9 C)  TempSrc: Oral   Oral  SpO2: 100% 98% 96% 97%  Weight:      Height:        Intake/Output Summary (Last 24 hours) at 12/05/2023 1907 Last data filed at 12/05/2023 1704 Gross per 24 hour  Intake 2161.25 ml  Output 950 ml  Net 1211.25 ml   Filed Weights   12/03/23 1642 12/03/23 2106  Weight: 67 kg 64.9 kg    Examination:   Constitutional: NAD, awake HEENT: conjunctivae and lids normal, EOMI CV: No cyanosis.   RESP: normal respiratory effort SKIN: warm, dry Neuro: II -  XII grossly intact.     Data Reviewed: I have personally reviewed labs and imaging studies  Time spent: 50 minutes  Garrison Kanner, MD [facility] If 7PM-7AM, please contact night-coverage 12/05/2023, 7:07 PM

## 2023-12-05 NOTE — Progress Notes (Signed)
 Initial Nutrition Assessment  DOCUMENTATION CODES:   Not applicable  INTERVENTION:   -Continue dysphagia 1 diet with thin liquids -Continue Ensure Plus High Protein po BID, each supplement provides 350 kcal and 20 grams of protein -Continue MVI with minerals daily -Continue 500 mg vitamin C BID -Continue 220 mg zinc sulfate daily x 14 days  NUTRITION DIAGNOSIS:   Increased nutrient needs related to wound healing as evidenced by estimated needs.  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  Pt admitted with severe sepsis and acute on chronic respiratory with hypoxia due to CAP.   6/4- s/p BSE- dysphagia 1 diet with thin liquids  Reviewed I/O's: +1.3 L x 24 hours and +3.1 L since admission  UOP: 650 ml x 24 hours  Per [facility] notes, full thickness wounds to bilateral legs and rt heel related to PAD. Per discussion with MD and RN, rt heel wound appears to be pressure related.   Case discussed with RN, who reports pt eats well with feeding assistance and takes medications crushed in applesauce without difficulty.   Spoke with pt at bedside, who was pleasant and in good spirits at time of visit. Pt reports feeling better than he did yesterday. Pt reports he has a good appetite and consumes 3 meals per day. He also consumes 2 Ensure supplements daily. [facility] and is [facility] for the services provided there.   Pt denies any weight loss. He reports his UBW is around 143#. Reviewed wt hx; pt has experienced a 2.7% wt loss over the past 2  months, which is not significant for time frame.   Discussed importance of good meal and supplement intake to promote healing. Pt amenable to continue supplements.   Medications reviewed and include vitamin C, klonpin, neurontin , lactulose, protonix , thiamine, florastor, and zinc sulfate.   Labs reviewed: CBGS: 236 (inpatient orders for glycemic control are none).    NUTRITION - FOCUSED PHYSICAL EXAM:  Flowsheet Row Most Recent Value  Orbital Region No depletion  Upper Arm Region Mild depletion  Thoracic and Lumbar Region No depletion  Buccal Region No depletion  Temple Region No depletion  Clavicle Bone Region No depletion  Clavicle and Acromion Bone Region No depletion  Scapular Bone Region No depletion  Dorsal Hand Mild depletion  Patellar Region No depletion  Anterior Thigh Region No depletion  Posterior Calf Region No depletion  Edema (RD Assessment) Mild  Hair Reviewed  Eyes Reviewed  Mouth Reviewed  Skin Reviewed  Nails Reviewed       Diet Order:   Diet Order             DIET - DYS 1 Room service appropriate? Yes with Assist; Fluid consistency: Thin  Diet effective now                   EDUCATION NEEDS:   Education needs have been addressed  Skin:  Skin Assessment: Skin Integrity Issues: Skin Integrity Issues:: Other (Comment) Other: full thickness wounds to bilateral legs and rt heel related to PAD  Last BM:  12/05/23 (  type 6)  Height:   Ht Readings from Last 1 Encounters:  12/03/23 6\' 1"  (1.854 m)    Weight:   Wt Readings from Last 1 Encounters:  12/03/23 64.9 kg    Ideal Body Weight:  83.6 kg  BMI:  Body mass index is 18.88 kg/m.  Estimated Nutritional Needs:   Kcal:  1800-2000  Protein:  100-115 grams  Fluid:  1.8-2.0 L    Herschel Lords, RD, LDN, CDCES Registered Dietitian III Certified Diabetes Care and Education Specialist If unable to reach this RD, please use "RD Inpatient" group chat on secure chat between hours of 8am-4  pm daily

## 2023-12-05 NOTE — Progress Notes (Addendum)
 Speech Language Pathology Treatment: Dysphagia  Patient Details Name: Cory Moses MRN: [id number] DOB: 1960-10-20 Today's Date: 12/05/2023 Time: 1225-1310 SLP Time Calculation (min) (ACUTE ONLY): 45 min  Assessment / Plan / Recommendation Clinical Impression  Pt seen for toleration of diet. Pt sitting upright in bed upon entering room. Highlighted the full upright positioned supporting pt well by bed and w/ use of Pillow low behind back. Wife stated she is pleased w/ the Pureed foods in the diet; continues to have to feed pt d/t his UE weakness.  Wife reported that the coughing w/ po's began when he exhibited coughing on "cough syrup that i gave him at night b/f going to bed(per MD)". Pt denied any coughing w/ meals since yesterday. Pt lacks bottom Dentition; upper Denture plate only, and he "doesn't chew the foods that well".  On [place] 4L, afebrile. WBC trending down.    Pt appears to present w/ grossly functional pharyngeal phase swallowing --  No immediate, overt clinical s/s of aspiration during po trials, including thin liquids sipped via Straw as pt held Cup and fed self. Regarding the oral phase, pt appears to demonstrate improved bolus control and manipulation w/ swallowing and oral clearing w/ the Pureed consistency foods(as he consumed at [employer] w/ Wife today). Pt is unable to effectively masticate solid foods(as at home) d/t No Bottom Dentition(upper plate only).  Pt appears at reduced risk for aspiration when following general aspiration precautions and using a modified food diet consistency- Purees for now(and per Wife's request). However, pt does have challenging factors that could impact oropharyngeal swallowing to include chronic Pain/discomfort(on Methadone  at baseline), deconditioning/weakness, UE tremors w/ inability to feed self(BUT able to hold Cup to drink), and Vascular Dementia. These factors can increase risk for aspiration, dysphagia as well as decreased oral intake  overall.    During po trials of thin liquids and Purees, pt consumed all trials w/ no overt coughing, decline in vocal quality, or change in respiratory presentation during/post trials. Oral phase appeared [facility] w/ timely bolus management and control of bolus propulsion for A-P transfer for swallowing. Oral clearing achieved w/ all trial consistencies -- w/ the trials given. Pt helped to Hold Cup to drink which increases safety of swallowing.    Post another discussion w/ Wife re: pt's overall medical and Cognitive status currently in setting of Acute illness, and w/ how well he does w/ the Pureed foods at his meals consuming ~75+% of the Dinner meal last night she said(very pleased w/ the intake she said), recommend continue w/ the Pureed consistency diet(Dysphagia level 1) w/ well-moistened foods; Thin liquids -- carefully monitor straw use, and pt should help to Hold Cup when drinking. Recommend general aspiration precautions including sitting upright w/ head up/forward- use of Pillows behind lower back to support forward, Small sips/bites SLOWLY, clearing mouth b/t po, and reducing distractions/talking during po intake/meals. Pills WHOLE vs Crushed in Puree for safer, easier swallowing -- pt has been doing this at home, and it was encouraged for now and for D/C to the Wife.    Thorough Education given on Pills in Puree; food consistencies, PREP, and food options; general aspiration and Reflux precautions to pt and Wife. ST services will f/u w/ toleration of diet while admitted for ongoing education, need for objective assessment, if indicated. At this time, mbss is not indicated per pt's toleration of oral intake of a modified consistency diet following aspiration precautions. NSG updated, agreed. MD updated. Recommend Dietician and Palliative Care(for  GOC) f/u for support. Wife agreed.      HPI HPI: Pt is a 63 y.o. male with medical history significant of Vascular Dementia, factor V Leyden mutation on  Eliquis , hypertension, hyperlipidemia, COPD on 2 L oxygen, PVD, chronic leg ulcers, GERD, back surgery, depression with anxiety, BPH, peripheral neuropathy, chronic pain on methadone , who presents with SOB.  Patient was last seen by Dr. Mikki Alexander on 11/12/2023 where he underwent an aortogram with selective bilateral lower extremity angiograms.  He had percutaneous transluminal angioplasty of the left common femoral artery and distal external iliac artery with Lifestream stents placed at that time.  Postprocedure the patient had good blood flow to his distal bilateral lower extremities.  At this admit, patient's wife is concerned about the wound to his right lower extremity and the healing process.  Pt resides at home w/ NSG coming to the home.  [facility] has [facility].  Per chart, pt has SOB in the past 3 days, which has been progressively worsening.   Per IMaging in chart:  CXR this admit- Collapse or consolidation in the left lower lung with small left pleural effusion. Chronic interstitial changes in the lungs, likely  fibrosis.  Pt has only had 2 other CXRs since 2018 which were clear or w/ atelectasis.      SLP Plan  Continue with current plan of care      Recommendations for follow up therapy are one component of a multi-disciplinary discharge planning process, led by the attending physician.  Recommendations may be updated based on patient status, additional functional criteria and insurance authorization.    Recommendations  Diet recommendations: Dysphagia 1 (puree);Thin liquid Liquids provided via: Cup;Straw (monitor) Medication Administration: Crushed with puree (vs Whole in Puree as able per NSG) Supervision: Staff to assist with self feeding;Full supervision/cueing for compensatory strategies (pt to hold cup to drink) Compensations: Minimize environmental distractions;Slow rate;Small sips/bites;Lingual sweep for clearance of pocketing;Multiple dry swallows after each bite/sip;Follow solids with  liquid Postural Changes and/or Swallow Maneuvers: Out of bed for meals;Seated upright 90 degrees;Upright 30-60 min after meal                 (Palliative Care f/u for GOC; Dietician) Oral care BID;Oral care before and after PO;Staff/trained caregiver to provide oral care (Denture care)   Frequent or constant Supervision/Assistance Dysphagia, oral phase (R13.11) (baseline Dementia; dependent feeder; top Dentition only)     Continue with current plan of care        Darla Edward, MS, CCC-SLP Speech Language Pathologist Rehab Services; [facility] [place] [phone] (ascom) Cory Moses  12/05/2023, 3:50 PM

## 2023-12-05 NOTE — Plan of Care (Signed)

## 2023-12-06 DIAGNOSIS — J9601 Acute respiratory failure with hypoxia: Secondary | ICD-10-CM | POA: Diagnosis not present

## 2023-12-06 DIAGNOSIS — I739 Peripheral vascular disease, unspecified: Secondary | ICD-10-CM | POA: Diagnosis not present

## 2023-12-06 DIAGNOSIS — L97919 Non-pressure chronic ulcer of unspecified part of right lower leg with unspecified severity: Secondary | ICD-10-CM | POA: Diagnosis not present

## 2023-12-06 DIAGNOSIS — L97929 Non-pressure chronic ulcer of unspecified part of left lower leg with unspecified severity: Secondary | ICD-10-CM | POA: Diagnosis not present

## 2023-12-06 DIAGNOSIS — J690 Pneumonitis due to inhalation of food and vomit: Secondary | ICD-10-CM

## 2023-12-06 LAB — STREP PNEUMONIAE URINARY ANTIGEN: Strep Pneumo Urinary Antigen: NEGATIVE

## 2023-12-06 LAB — CBC
HCT: 24.6 % — ABNORMAL LOW (ref 39.0–52.0)
Hemoglobin: 7.4 g/dL — ABNORMAL LOW (ref 13.0–17.0)
MCH: 27.2 pg (ref 26.0–34.0)
MCHC: 30.1 g/dL (ref 30.0–36.0)
MCV: 90.4 fL (ref 80.0–100.0)
Platelets: 271 10*3/uL (ref 150–400)
RBC: 2.72 MIL/uL — ABNORMAL LOW (ref 4.22–5.81)
RDW: 15.2 % (ref 11.5–15.5)
WBC: 10.1 10*3/uL (ref 4.0–10.5)
nRBC: 0 % (ref 0.0–0.2)

## 2023-12-06 LAB — BLOOD GAS, VENOUS
Acid-Base Excess: 5 mmol/L — ABNORMAL HIGH (ref 0.0–2.0)
Bicarbonate: 31 mmol/L — ABNORMAL HIGH (ref 20.0–28.0)
O2 Saturation: 77 %
Patient temperature: 37
pCO2, Ven: 50 mmHg (ref 44–60)
pH, Ven: 7.4 (ref 7.25–7.43)
pO2, Ven: 41 mmHg (ref 32–45)

## 2023-12-06 LAB — CULTURE, BLOOD (ROUTINE X 2)

## 2023-12-06 LAB — BASIC METABOLIC PANEL WITH GFR
Anion gap: 3 — ABNORMAL LOW (ref 5–15)
BUN: 27 mg/dL — ABNORMAL HIGH (ref 8–23)
CO2: 27 mmol/L (ref 22–32)
Calcium: 7.9 mg/dL — ABNORMAL LOW (ref 8.9–10.3)
Chloride: 112 mmol/L — ABNORMAL HIGH (ref 98–111)
Creatinine, Ser: 0.8 mg/dL (ref 0.61–1.24)
GFR, Estimated: >60 mL/min (ref >60)
Glucose, Bld: 98 mg/dL (ref 70–99)
Potassium: 3.7 mmol/L (ref 3.5–5.1)
Sodium: 142 mmol/L (ref 135–145)

## 2023-12-06 LAB — MAGNESIUM: Magnesium: 2.1 mg/dL (ref 1.7–2.4)

## 2023-12-06 NOTE — Progress Notes (Signed)
 Speech Language Pathology Treatment: Dysphagia  Patient Details Name: Cory Moses MRN: [id number] DOB: 10/02/1960 Today's Date: 12/06/2023 Time: 4098-1191 SLP Time Calculation (min) (ACUTE ONLY): 40 min  Assessment / Plan / Recommendation Clinical Impression  Pt seen for toleration of diet. Pt resting in bed upon entering room. Wife stated she continues to be pleased w/ the Pureed foods in the diet; continues to have to feed pt d/t his UE weakness. Dtr arrived.  Wife reported that the coughing w/ po's began when he exhibited coughing on "cough syrup that i gave him at night b/f going to bed(per MD)". Pt denied any coughing w/ meals but he seemed "sleepy today". Pt lacks bottom Dentition; upper Denture plate only, and he "doesn't chew the foods that well".  On [place] 4L, afebrile. WBC trending down.    Pt appears to present w/ grossly functional pharyngeal phase swallowing --  No immediate, overt clinical s/s of aspiration during po trials of thin liquids sipped via Straw as pt held Cup and fed self w/ support. Wife denied any swallowing issues w/ the puree foods stating he "really liked the puddings". Regarding the oral phase, pt appears to demonstrate improved bolus control and manipulation w/ swallowing and oral clearing w/ the Pureed consistency foods. Pt is unable to effectively masticate solid foods(as at home) d/t No Bottom Dentition(upper plate only).  Pt appears at reduced risk for aspiration when following general aspiration precautions and using a modified food diet consistency- Purees for now(and per Wife's request). However, pt does have challenging factors that could impact oropharyngeal swallowing to include chronic Pain/discomfort(on Methadone  at baseline), deconditioning/weakness, UE tremors w/ inability to feed self(BUT able to hold Cup to drink), and Vascular Dementia. These factors can increase risk for aspiration, dysphagia as well as decreased oral intake overall.    During  po trials of thin liquids, pt consumed sips w/ no overt coughing, decline in vocal quality, or change in respiratory presentation during/post trials. Oral phase appeared [facility] w/ timely bolus management and control of bolus propulsion for A-P transfer for swallowing. Pt helped to Hold Cup to drink which increases safety of swallowing.    Post another discussion w/ Wife re: pt's overall medical and Cognitive status currently in setting of Acute illness, and w/ how well he does w/ the Pureed foods at his meals consuming "most of his meals now" (very pleased w/ the intake she said), recommend continue w/ the Pureed consistency diet(Dysphagia level 1) w/ well-moistened foods; Thin liquids -- carefully monitor straw use, and pt should help to Hold Cup when drinking. Recommend general aspiration precautions including sitting upright w/ head up/forward- use of Pillows behind lower back to support forward, Small sips/bites SLOWLY, clearing mouth b/t po, and reducing distractions/talking during po intake/meals. Pills WHOLE vs Crushed in Puree for safer, easier swallowing -- pt has been doing this at home, and it was encouraged for now and for D/C to the Wife. Instructed po's to be given only when pt is fully awake/alert. Wife agreed.   Thorough Education given on Pills in Puree; food consistencies, PREP, and food options; general aspiration and Reflux precautions to pt and Wife. ST services will f/u w/ toleration of diet while admitted for ongoing education, need for objective assessment, if indicated. At this time, mbss is not indicated per pt's toleration of oral intake of a modified consistency diet following aspiration precautions. NSG updated, agreed. MD updated. Recommend Dietician and Palliative Care(for GOC) f/u for support. Wife agreed.  HPI HPI: Pt is a 63 y.o. 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  Patient was last seen by Dr. Mikki Alexander on 11/12/2023 where he underwent an aortogram with selective bilateral lower extremity angiograms.  He had percutaneous transluminal angioplasty of the left common femoral artery and distal external iliac artery with Lifestream stents placed at that time.  Postprocedure the patient had good blood flow to his distal bilateral lower extremities.  At this admit, patient's wife is concerned about the wound to his right lower extremity and the healing process.  Pt resides at home w/ NSG coming to the home.  [facility] has [facility].  Per chart, pt has SOB in the past 3 days, which has been progressively worsening.   Per IMaging in chart:  CXR this admit- Collapse or consolidation in the left lower lung with small left pleural effusion. Chronic interstitial changes in the lungs, likely  fibrosis.  Pt has only had 2 other CXRs since 2018 which were clear or w/ atelectasis.      SLP Plan  Continue with current plan of care          Recommendations  Diet recommendations: Dysphagia 1 (puree);Thin liquid (gravies) Liquids provided via: Cup;Straw (pinch for flow control) Medication Administration: Crushed with puree Supervision: Staff to assist with self feeding;Full supervision/cueing for compensatory strategies (pt to help hold cup) Compensations: Minimize environmental distractions;Slow rate;Small sips/bites;Lingual sweep for clearance of pocketing;Multiple dry swallows after each bite/sip;Follow solids with liquid Postural Changes and/or Swallow Maneuvers: Out of bed for meals;Seated upright 90 degrees;Upright 30-60 min after meal                 (Dietician f/u; Palliative Care f/u for GOC overall) Oral care BID;Staff/trained caregiver to provide oral care   Frequent or constant Supervision/Assistance Dysphagia, oral phase  (R13.11) (baseline Dementia; dependent feeder; top Denture only w/ ill-fit)     Continue with current plan of care        Darla Edward, MS, CCC-SLP Speech Language Pathologist Rehab Services; [facility] [place] [phone] (ascom) Keyshaun Exley  12/06/2023, 2:24 PM

## 2023-12-06 NOTE — Progress Notes (Signed)
 PROGRESS NOTE    Cory Moses  ZOX:[id number] DOB: August 16, 1960 DOA: 12/03/2023 PCP: System, Provider Not In  256A/256A-AA  LOS: 3 days   Brief hospital course:   Assessment & Plan: Cory Moses is a 63 y.o. 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  Per EMS report, patient was found to have severe respiratory distress, oxygen desaturation to 70s, CPAP was started by EMS, which was changed to BiPAP in the ED due to severe respiratory distress.    Severe sepsis secondary to pneumonia.  Acute on chronic hypoxemic respiratory failure secondary to aspiration pneumonia Patient had a severe respite distress at time of admission required BiPAP.  Currently on 4 L oxygen, baseline on 2 L oxygen. This is secondary to left lower lobe pneumonia. Patient has COPD, does not seem to have any bronchospasm. Plan: --tx PNA --Continue supplemental O2 to keep sats >=90%, wean as tolerated  Left lower lobe pneumonia most likely aspiration pneumonia. Dysphagia. --per wife, patient has significant dysphagia with frequent choking.  Prior to admission, patient had another choking episode.   --abx changed to Unasyn --cont Unasyn --SLP --Dys 1 diet --discussed with wife about increased risk of aspiration with taking sedatives.   1/4 blood culture methicillin resistant staph epi, could be contaminant. Reculture.   Peripheral arterial disease. Multiple ulcers in the lower extremities. Right heel deep wound. Patient has multiple ulcers in the lower extremities.  Per vascular, "on 11/12/2023 underwent an aortogram with selective bilateral lower extremity angiograms postprocedure he had percutaneous transluminal angioplasty of the left common femoral artery  and distal external iliac artery lifestream stents placement.  Postprocedure patient had good blood flow to both his distal bilateral lower extremities."  --current inpatient bilateral lower extremity ABIs are inconclusive.   --non-healing wounds are less likely due to ischemia.  Severe debility. Chronic back pain. Patient has chronic back pain, had a prior back surgery more than 10 years ago.  He has significant leg weakness, has been bedbound for 18 months, and had developed LE contractures. MRI of the lumbar spine neg for spinal stenosis.   COPD on 2L O2 No bronchospasm. --cont home bronchodilators   Chronic pain syndrome on methadone . --cont home methadone  TID --cont home gabapentin   Anxiety on chronic benzo --cont home Klonopin  TID   Essential hypertension. --BP wnl without antihypertensives   Factor V Leiden mutation. Continue Eliquis .   Severe constipation. 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  Pt was ordered scheduled senna, miralax, lactulose and lubiprostone on admission.  Per RN, started to have loose stools. --d/c'ed scheduled senna and Miralax --d/c lactulose --cont home lubiprostone   DVT prophylaxis: On:Eliquis  Code Status: Full code  Family Communication: wife updated at bedside today Level of care: Progressive Dispo:   The patient is from: home Anticipated d/c is to: home Anticipated d/c date is: 1-2 days   Subjective and Interval History:  Wife said pt did much better with oral intake with pureed foods.  Wife reported pt more lethargic today, but wanted pt to continue receiving all pt's home methadone , klonopin  and gabapentin .     Objective: Vitals:   12/06/23 0815 12/06/23 1138 12/06/23 1248 12/06/23 1514  BP: 123/61  130/65 125/73  Pulse: (!) 48  66 69  Resp: 15  14  Temp: 98.7 F (37.1 C)  97.9 F (36.6 C) 99.2 F (37.3 C)  TempSrc: Oral  Oral Axillary  SpO2: 99% 95% 92% 96%  Weight:      Height:         Intake/Output Summary (Last 24 hours) at 12/06/2023 1738 Last data filed at 12/06/2023 1704 Gross per 24 hour  Intake 840 ml  Output 500 ml  Net 340 ml   Filed Weights   12/03/23 1642 12/03/23 2106  Weight: 67 kg 64.9 kg    Examination:   Constitutional: NAD, sleeping CV: No cyanosis.   RESP: normal respiratory effort, on 2L   Data Reviewed: I have personally reviewed labs and imaging studies  Time spent: 50 minutes  Cory Kanner, MD [facility] If 7PM-7AM, please contact night-coverage 12/06/2023, 5:38 PM

## 2023-12-06 NOTE — Progress Notes (Signed)
 Progress Note    12/06/2023 11:41 AM * No surgery found *  Subjective: Cory Moses is a 63 year old male who on 11/12/2023 underwent an aortogram with selective bilateral lower extremity angiograms postprocedure he had percutaneous transluminal angioplasty of the left common femoral artery and distal external iliac artery lifestream stents placement.  Postprocedure patient had good blood flow to both his distal bilateral lower extremities.  Upon workup he was noted to have an aspiration pneumonia with cough and was admitted to the hospital.  Vascular surgery was consulted due to complaints concern of bilateral lower extremity ulcers.  She claims that the ulcer to his left leg is healing well the ulcer to his right leg seems to get worse.  She also states that the day after the procedure he was brought back to the emergency room for a head to toe rash with welts in which he was given high-dose steroids in the ED and then sent home with steroid taper pack.  Since this time the patient's blood sugars have been 300s or higher.  Today rest comfortably in bed.  Has no complaints.  His vitals all remained stable.   Vitals:   12/06/23 0815 12/06/23 1138  BP: 123/61   Pulse: (!) 48   Resp: 15   Temp: 98.7 F (37.1 C)   SpO2: 99% 95%   Physical Exam: Cardiac:  RRR, normal S1 and S2.  No rubs clicks gallops or murmurs noted. Lungs: On auscultation patient's lungs are rhonchorous throughout.  Slight wheezing noted on deep inspiration.  Nonlabored breathing.  Patient not requiring any nasal cannula oxygen this morning. Incisions: None Extremities: Bilateral lower extremities with ulcers to both lateral calfs.  Patient's feet are warm to touch and he has positive strong Doppler pulses in both feet.  DP/PT. Abdomen: Positive bowel sounds throughout, soft, nontender nondistended. Neurologic: Alert and oriented x 3, answers questions appropriately follows commands.  CBC    Component Value  Date/Time   WBC 10.1 12/06/2023 0416   RBC 2.72 (L) 12/06/2023 0416   HGB 7.4 (L) 12/06/2023 0416   HCT 24.6 (L) 12/06/2023 0416   PLT 271 12/06/2023 0416   MCV 90.4 12/06/2023 0416   MCH 27.2 12/06/2023 0416   MCHC 30.1 12/06/2023 0416   RDW 15.2 12/06/2023 0416   LYMPHSABS 4.1 (H) 12/03/2023 1646   MONOABS 1.1 (H) 12/03/2023 1646   EOSABS 0.2 12/03/2023 1646   BASOSABS 0.2 (H) 12/03/2023 1646    BMET    Component Value Date/Time   NA 142 12/06/2023 0416   K 3.7 12/06/2023 0416   CL 112 (H) 12/06/2023 0416   CO2 27 12/06/2023 0416   GLUCOSE 98 12/06/2023 0416   BUN 27 (H) 12/06/2023 0416   CREATININE 0.80 12/06/2023 0416   CALCIUM  7.9 (L) 12/06/2023 0416   GFRNONAA >60 12/06/2023 0416   GFRAA >60 01/02/2017 1610    INR    Component Value Date/Time   INR 1.8 (H) 12/03/2023 1646     Intake/Output Summary (Last 24 hours) at 12/06/2023 1141 Last data filed at 12/06/2023 1000 Gross per 24 hour  Intake 1320 ml  Output 550 ml  Net 770 ml     Assessment/Plan:  63 y.o. male is s/p bilateral lower extremity angiograms on 11/12/2023 who presents to [facility] emergency department for concerns of shortness of breath.  Upon workup he was noted to have aspiration pneumonia.  Vascular surgery was consulted due to continued wounds of bilateral lower extremities right greater than left.* No  surgery found *   PLAN Patient underwent bilateral lower extremity ABIs.  They were done by the hospital and inconclusive.  Therefore without having any results related to this I went back and reviewed the films of the patient's prior angiogram.  He specifically had a aortic and iliac disease at that time which was corrected.  Both of his lower extremities are warm to touch with strong Doppler pulses.  This correlates with the previous angiogram on 11/12/2023.  Therefore I have to believe that the patient's healing of his left lower extremity wound with a nonhealing of his right lower extremity wounds  may be related to him being treated with high-dose steroids for a possible allergic reaction to the IV contrast dye he got during the procedure or some medication he was taking postprocedure.  This in turn elevated his blood sugars to be over 300 for over a week.  Upon entering [facility] hospital his glucoses were 236 and he had 2 days.  I would recommend good glucose control with the current antibiotics he is on and wound care consults to follow to assess if his wound in his right lower extremity is getting better.  If the wound continues not to heal this would justify doing another angiogram but this would also put the patient at higher risk for possibly acute kidney injury thus requiring him to have dialysis 3 times a week..  I recommend conservative measures with wound care glucose control and antibiotics at this time and we can reassess afterwards for possible procedure.  DVT prophylaxis: Eliquis  5 mg twice daily, aspirin  81 mg daily Lipitor 10 mg daily.   Annamaria Barrette [facility] 12/06/2023 11:41 AM

## 2023-12-06 NOTE — Plan of Care (Signed)

## 2023-12-07 DIAGNOSIS — Z7189 Other specified counseling: Secondary | ICD-10-CM

## 2023-12-07 DIAGNOSIS — R9431 Abnormal electrocardiogram [ECG] [EKG]: Secondary | ICD-10-CM | POA: Diagnosis not present

## 2023-12-07 DIAGNOSIS — J189 Pneumonia, unspecified organism: Secondary | ICD-10-CM | POA: Diagnosis not present

## 2023-12-07 DIAGNOSIS — D6851 Activated protein C resistance: Secondary | ICD-10-CM | POA: Diagnosis not present

## 2023-12-07 DIAGNOSIS — I739 Peripheral vascular disease, unspecified: Secondary | ICD-10-CM | POA: Diagnosis not present

## 2023-12-07 DIAGNOSIS — Z515 Encounter for palliative care: Secondary | ICD-10-CM | POA: Diagnosis not present

## 2023-12-07 DIAGNOSIS — G894 Chronic pain syndrome: Secondary | ICD-10-CM

## 2023-12-07 LAB — CBC
HCT: 26.3 % — ABNORMAL LOW (ref 39.0–52.0)
Hemoglobin: 8.1 g/dL — ABNORMAL LOW (ref 13.0–17.0)
MCH: 27.5 pg (ref 26.0–34.0)
MCHC: 30.8 g/dL (ref 30.0–36.0)
MCV: 89.2 fL (ref 80.0–100.0)
Platelets: 279 10*3/uL (ref 150–400)
RBC: 2.95 MIL/uL — ABNORMAL LOW (ref 4.22–5.81)
RDW: 14.8 % (ref 11.5–15.5)
WBC: 8.9 10*3/uL (ref 4.0–10.5)
nRBC: 0 % (ref 0.0–0.2)

## 2023-12-07 MED ORDER — AMOXICILLIN-POT CLAVULANATE 500-125 MG PO TABS: 1.0000 | ORAL_TABLET | Freq: Two times a day (BID) | ORAL | Status: AC

## 2023-12-07 MED ORDER — FUROSEMIDE 10 MG/ML IJ SOLN
80.0000 mg | INTRAMUSCULAR | Status: AC
  Administered 2023-12-07: 80 mg via INTRAVENOUS
  Filled 2023-12-07: qty 8

## 2023-12-07 MED ORDER — HYOSCYAMINE SULFATE 0.125 MG SL SUBL: 0.1250 mg | SUBLINGUAL_TABLET | SUBLINGUAL | Status: AC | PRN

## 2023-12-07 MED ORDER — FUROSEMIDE 10 MG/ML IJ SOLN
40.0000 mg | INTRAMUSCULAR | Status: DC
  Filled 2023-12-07: qty 4

## 2023-12-07 NOTE — Plan of Care (Signed)
   Problem: Education: Goal: Knowledge of General Education information will improve Description: Including pain rating scale, medication(s)/side effects and non-pharmacologic comfort measures Outcome: Progressing   Problem: Coping: Goal: Level of anxiety will decrease Outcome: Progressing

## 2023-12-07 NOTE — Progress Notes (Signed)
 EMS came, I told them to let me know when they were ready for report and they left without getting report. I was sitting right at the nurses desk when they left.

## 2023-12-07 NOTE — TOC Transition Note (Signed)
 Transition of Care [facility]) - Discharge Note   Patient Details  Name: Cory Moses MRN: [id number] Date of Birth: [birth year]  [facility] CM/SW Contact:  Mahalia Dykes C Soua Lenk, RN Phone Number: 12/07/2023, 2:12 PM   Clinical Narrative:    Spoke with patient's spouse regarding discharge home today. Per MD patient family is requesting hospice. Patient spouse confirmed hospice is requested. She was provided choices for hospice including: [facility], [facility] and [facility]. Patient's wife chose [facility]. She was advised EMS would be arranged. She stated she or her daughter would be home for patient's arrival.  Referral made to [facility] from [facility].   2:00pm  Spoke with Corbin Dess from [facility]. She stated  [facility] provides [facility]. She stated she would speak with the patients wife to see if she would like to continue with [facility]. Per Corbin Dess if family chooses [facility] benefit will be revoked.   2:15pm RNCM spoke with Robin from [facility]. She stated she had spoken with patient's spouse and she decided to stay with [facility] and receive [facility].   2:20pm EMS arranged Nurse notified.   TOC signing off.      Barriers to Discharge: Continued Medical Work up   Patient Goals and CMS Choice            Discharge Placement                       Discharge Plan and Services Additional resources added to the After Visit Summary for       Post Acute Care Choice: Resumption of Svcs/PTA Provider                               Social Drivers of Health (SDOH) Interventions SDOH Screenings   Food Insecurity: Patient Unable To Answer (12/03/2023)  Housing: Patient Unable To Answer (12/03/2023)  Transportation Needs: No Transportation Needs (12/03/2023)  Utilities: Not At Risk (12/03/2023)  Financial Resource Strain: Low Risk  (12/08/2020)   Received [facility]  Social Connections: Unknown (12/03/2023)  Tobacco Use: Medium Risk (11/16/2023)  Health Literacy:  Medium Risk (10/24/2021)   Received from [facility], [facility]     Readmission Risk Interventions    12/04/2023   12:47 PM  Readmission Risk Prevention Plan  Transportation Screening Complete  Medication Review (RN Care Manager) Complete  PCP or Specialist appointment within 3-5 days of discharge Complete  HRI or Home Care Consult Complete  SW Recovery Care/Counseling Consult Complete  Palliative Care Screening Not Applicable  Skilled Nursing Facility Not Applicable

## 2023-12-07 NOTE — Progress Notes (Signed)
 SLP Cancellation Note  Patient Details Name: Maykel Reitter MRN: [id number] DOB: 01/13/1961   Cancelled treatment:       Reason Eval/Treat Not Completed:  (chart reviewed; consulted w/ MD and met w/ Wife.)  Met w/ Wife and briefly discussed pt's status; GOC ahead. Discussed diet consistency and food prep options. Highlighted recommendations on handouts for the prep of Pureed foods. Wife agreed. Answered questions; offered Wife support.  Noted pt is transitioning home w/ Hospice services per MD note.     Darla Edward, MS, CCC-SLP Speech Language Pathologist Rehab Services; [facility] [phone] (ascom) Rien Marland 12/07/2023, 3:21 PM

## 2023-12-07 NOTE — Progress Notes (Signed)
 [facility] Liaison Note  Received request from [facility], Annmarie Basket, Transitions of Care Manager, for hospice services at home after discharge.  Spoke with spouse  to initiate education related to hospice philosophy, services, and team approach to care. Spouse  verbalized understanding of information given.  Per discussion, the plan is for discharge home today.   DME needs discussed.  Patient has the following equipment in the home:  hospital bed, home 02, hoyer lift and WC.  Patient/family requests the following equipment in the home:  None.  The address has been verified and is correct in the chart.   Please send signed and completed DNR home with the patient/family.  Please provide prescriptions at discharge as needed to ensure ongoing symptom management.   [facility] information and contact numbers given to spouse.   Above information shared with Keona Alston, R, Transitions of Care Manager.     Please call with any Hospice related questions or concerns.  Thank you for the opportunity to participate in this patient's care.  Helmut Lobe, [facility] Liaison [phone]

## 2023-12-07 NOTE — IPAL (Signed)
  Interdisciplinary Goals of Care Family Meeting   Date carried out: 12/07/2023  Location of the meeting: Bedside  Member's involved: Physician and Family Member or next of kin  Durable Power of Attorney or Environmental health practitioner: Mary Kaye/POA/wife    Discussion: We discussed goals of care for Cory Moses   Code status:   Code Status: Limited: Do not attempt resuscitation (DNR) -DNR-LIMITED -Do Not Intubate/DNI    Disposition: Home with Hospice  Time spent for the meeting: 35 mins    Brenna Cam, MD  12/07/2023, 11:52 AM

## 2023-12-08 LAB — LEGIONELLA PNEUMOPHILA SEROGP 1 UR AG: L. pneumophila Serogp 1 Ur Ag: NEGATIVE

## 2023-12-08 LAB — CULTURE, BLOOD (ROUTINE X 2): Culture: NO GROWTH

## 2023-12-09 LAB — CULTURE, BLOOD (ROUTINE X 2)
Culture: NO GROWTH
Culture: NO GROWTH
Special Requests: ADEQUATE
Special Requests: ADEQUATE

## 2023-12-09 NOTE — Discharge Summary (Signed)
 Physician Discharge Summary   Patient: Cory Moses MRN: [id number] DOB: 12-26-1960  Admit date:     12/03/2023  Discharge date: 12/07/2023  Discharge Physician: Brenna Cam   PCP: System, Provider Not In   Recommendations at discharge:    F/up with outpt providers as requested  Discharge Diagnoses: Principal Problem:   CAP (community acquired pneumonia) Active Problems:   Acute hypoxic respiratory failure (HCC)   Severe sepsis (HCC)   COPD (chronic obstructive pulmonary disease) (HCC)   HTN (hypertension)   HLD (hyperlipidemia)   Factor V Leiden mutation (HCC)   Prolonged QT interval   BPH (benign prostatic hyperplasia)   Chronic pain syndrome   PAD (peripheral artery disease) (HCC)   Depression with anxiety   Ulcers of both lower legs (HCC)   Hospice care  Hospital Course: Assessment and Plan:  63 y.o. 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  Per EMS report, patient was found to have severe respiratory distress, oxygen desaturation to 70s, CPAP was started by EMS, which was changed to BiPAP in the ED due to severe respiratory distress.    Severe sepsis secondary to pneumonia. Treated  Acute on chronic hypoxemic respiratory failure secondary to aspiration pneumonia Patient had a severe respite distress at time of admission required BiPAP.  Currently on 4 L oxygen, baseline on 2 L oxygen. This is secondary to left lower lobe pneumonia. Patient has COPD, does not seem to have any bronchospasm. Treated and resolved.   Left lower lobe pneumonia most likely aspiration pneumonia. Dysphagia. --per wife, patient has significant dysphagia with frequent choking.  Prior to admission, patient had another choking episode.   --treated with  Abx --Dys 1 diet --discussed with wife about increased risk of aspiration with taking sedatives.   1/4 blood culture methicillin resistant staph epi, likely contaminant. Reculture.neg   Peripheral arterial disease. Multiple ulcers in the lower extremities. Right heel deep wound. Patient has multiple ulcers in the lower extremities.  Per vascular, "on 11/12/2023 underwent an aortogram with selective bilateral lower extremity angiograms postprocedure he had percutaneous transluminal angioplasty of the left common femoral artery and distal external iliac artery lifestream stents placement.  Postprocedure patient had good blood flow to both his distal bilateral lower extremities."  --current inpatient bilateral lower extremity ABIs are inconclusive.   --non-healing wounds are less likely due to ischemia.   Severe debility. Chronic back pain. Patient has chronic back pain, had a prior back surgery more than 10 years ago.  He has significant leg weakness, has been bedbound for 18 months, and had developed LE contractures. MRI of the lumbar spine neg for spinal stenosis.   COPD on 2L O2 No bronchospasm. --cont home bronchodilators   Chronic pain syndrome on methadone . --cont home methadone  TID --cont home gabapentin    Anxiety on chronic benzo --cont home Klonopin  TID   Essential hypertension. --BP wnl without antihypertensives   Factor V Leiden mutation. Continue Eliquis .   Severe constipation. 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  Resolved now. --cont home lubiprostone   Plan for Hospice at home per family wishes although on the day of discharge [facility] mentioned they don't work with outside Hospice agency, so he can't be under Hospice but they would provide equivalent treatment to keep him comfortable.  Consultants: Vascular Surgery  Disposition: Hospice care Diet recommendation:  Discharge Diet Orders (From admission, onward)     Start      Ordered   12/07/23 0000  Diet - low sodium heart healthy        12/07/23 1249           Carb modified diet DISCHARGE MEDICATION: Allergies as of 12/07/2023       Reactions   Lyrica [pregabalin]    Morphine Other (See Comments)   Other reaction(s): Other (See Comments), Other (See Comments) Doesn't like Morphine, mental status changes. Doesn't like Morphine Doesn't like Morphine, mental status changes.    Doesn't like Morphine    Other reaction(s): Other (See Comments), Other (See Comments) Doesn't like Morphine, mental status changes. Doesn't like Morphine Other Reaction(s): Other (See Comments) Doesn't like Morphine, mental status changes.    Doesn't like Morphine    Other reaction(s): Other (See Comments), Other (See Comments) Doesn't like Morphine, mental status changes. Doesn't like Morphine    Other reaction(s): Other (See Comments), Other (See Comments)  Doesn't like Morphine, mental status changes.  Doesn't like Morphine  Doesn't like Morphine, mental status changes.  Doesn't like Morphine  Other reaction(s): Other (See Comments), Other (See Comments) Doesn't like Morphine, mental status changes. Doesn't like Morphine   Morphine And Codeine Anxiety        Medication List     STOP taking these medications    Breztri  Aerosphere 160-9-4.8 MCG/ACT Aero inhaler Generic drug: budesonide -glycopyrrolate -formoterol    melatonin 3 MG Tabs tablet   predniSONE  10 MG (21) Tbpk tablet Commonly known as: STERAPRED UNI-PAK 21 TAB       TAKE these medications    albuterol  108 (90 Base) MCG/ACT inhaler Commonly known as: VENTOLIN  HFA Inhale 2 puffs into the lungs every 4 (four) hours as needed for wheezing or shortness of breath.   alfuzosin  10 MG 24 hr tablet Commonly known as: UROXATRAL  Take 1 tablet (10 mg total) by mouth daily with breakfast.   amLODipine  10 MG tablet Commonly known as: NORVASC  Take 10 mg by mouth daily.   amoxicillin -clavulanate 500-125 MG  tablet Commonly known as: Augmentin  Take 1 tablet by mouth 2 (two) times daily for 5 days.   apixaban  5 MG Tabs tablet Commonly known as: ELIQUIS  Take 5 mg by mouth 2 (two) times daily.   Aspercreme Lidocaine  4 % Crea Generic drug: Lidocaine  HCl Apply topically.   aspirin  EC 81 MG tablet Take 1 tablet (81 mg total) by mouth daily. Swallow whole.   atorvastatin  10 MG tablet Commonly known as: Lipitor Take 1 tablet (10 mg total) by mouth daily.   Ben Gay Ultra Strength 10-10-28 % Crea Generic drug: Camphor-Menthol-Methyl Sal Apply topically.   bisacodyl 10 MG suppository Commonly known as: DULCOLAX Place 10 mg rectally 2 (two) times a week. Monday and Thursday   Calcium  Carb-Cholecalciferol  600-10 MG-MCG Tabs Take 1 tablet by mouth 2 (two) times daily.   cetirizine 10 MG tablet Commonly known as: ZYRTEC Take 10 mg by mouth daily.   clonazePAM  0.5 MG tablet Commonly known as: KLONOPIN  Take 0.5-1 mg by mouth 3 (three) times daily.   dutasteride  0.5 MG capsule Commonly known as: AVODART  Take 1 capsule (0.5 mg total) by mouth daily.   Ensure Take by mouth.   esomeprazole 40 MG capsule Commonly known as: NEXIUM Take 40 mg by mouth daily.   FLUoxetine  40 MG capsule Commonly known as: PROZAC  Take 40 mg by mouth daily.  fluticasone 50 MCG/ACT nasal spray Commonly known as: FLONASE Place into both nostrils.   gabapentin  600 MG tablet Commonly known as: NEURONTIN  Take 300 mg by mouth 2 (two) times daily.   Gas Relief Extra Strength 125 MG chewable tablet Generic drug: simethicone Chew 125 mg by mouth every 6 (six) hours as needed for flatulence.   Geri-Tussin 100 MG/5ML liquid Generic drug: guaiFENesin  Take 10 mLs by mouth every 4 (four) hours as needed for cough.   Hydrocortisone Acetate 1 % Crea Apply 1 Application topically 2 (two) times daily as needed.   hydrocortisone cream 1 % Apply topically.   hyoscyamine  0.125 MG SL tablet Commonly known as:  LEVSIN SL Place 1 tablet (0.125 mg total) under the tongue every 4 (four) hours as needed (excess oral secretions).   lactulose  10 GM/15ML solution Commonly known as: CHRONULAC  Take 20 g by mouth daily.   lisinopril  2.5 MG tablet Commonly known as: ZESTRIL  Take 2.5 mg by mouth daily.   loperamide 2 MG capsule Commonly known as: IMODIUM Take 2 mg by mouth 2 (two) times daily.   lubiprostone  8 MCG capsule Commonly known as: AMITIZA  Take 8 mcg by mouth 2 (two) times daily with a meal.   methadone  10 MG tablet Commonly known as: DOLOPHINE  Take 10 mg by mouth 3 (three) times daily.   mirtazapine 7.5 MG tablet Commonly known as: REMERON Take 7.5 mg by mouth at bedtime.   nystatin powder Commonly known as: MYCOSTATIN/NYSTOP Apply 1 Application topically 2 (two) times daily.   ondansetron  8 MG tablet Commonly known as: ZOFRAN  Take 8 mg by mouth 3 (three) times daily.   OXYGEN 2 L at bedtime.   polyethylene glycol powder 17 GM/SCOOP powder Commonly known as: GLYCOLAX /MIRALAX  Take 17 g by mouth daily.   risperiDONE 1 MG tablet Commonly known as: RISPERDAL Take 1-1.5 mg by mouth 2 (two) times daily. Take 1 mg (one tablet) every morning and take along with 0.5 mg (one tablet) for total 1.5 mg at bedtime only   risperiDONE 0.5 MG tablet Commonly known as: RISPERDAL Take 0.5 mg by mouth at bedtime. Take along with 1 mg (one tablet) for total 1.5 mg at bedtime only   Santyl 250 UNIT/GM ointment Generic drug: collagenase Apply 1 Application topically daily.   senna-docusate 8.6-50 MG tablet Commonly known as: Senokot-S Take 2 tablets by mouth 2 (two) times daily.   Testosterone  1.62 % Gel Apply 4 Pump topically daily. Apply two pumps to each shoulder/upper arm once daily   thiamine  100 MG tablet Take 100 mg by mouth daily.   Trelegy Ellipta 100-62.5-25 MCG/ACT Aepb Generic drug: Fluticasone-Umeclidin-Vilant Inhale 1 puff into the lungs daily.   Vitamin C  250 MG  Chew Chew 1 tablet by mouth daily.               Discharge Care Instructions  (From admission, onward)           Start     Ordered   12/07/23 0000  Discharge wound care:       Comments: As above   12/07/23 1249            Discharge Exam: Filed Weights   12/03/23 1642 12/03/23 2106  Weight: 67 kg 64.9 kg   Constitutional: NAD, awake HEENT: conjunctivae and lids normal, EOMI CV: No cyanosis.   RESP: normal respiratory effort SKIN: warm, dry Neuro: II - XII grossly intact.    Condition at discharge: poor  The results of significant diagnostics  from this hospitalization (including imaging, microbiology, ancillary and laboratory) are listed below for reference.   Imaging Studies: MR LUMBAR SPINE WO CONTRAST Result Date: 12/05/2023 EXAM: MRI LUMBAR SPINE 12/04/2023 10:33:25 PM TECHNIQUE: Multiplanar multisequence MRI of the lumbar spine was performed without the administration of intravenous contrast. COMPARISON: MRI of the lumbar spine 02/09/2014. CLINICAL HISTORY: Spinal stenosis, lumbar. FINDINGS: BONES AND ALIGNMENT: Remote endplate compression fractures are present at all levels from T11 through S1. No acute fractures are present. SPINAL CORD: The conus medullaris terminates at L1-L2. SOFT TISSUES: Multiple simple cysts are present in the kidneys bilaterally. The largest is at the upper pole of the right kidney measuring 4.2 cm. T12-L1: Mild disc bulging and exaggerated kyphosis with effacement of the ventral CSF. The foramina are patent bilaterally. L1-L2: No significant disc herniation. No spinal canal stenosis or neural foraminal narrowing. L2-L3: Mild disc bulging is present without focal stenosis. L3-L4: No significant disc herniation. No spinal canal stenosis or neural foraminal narrowing. L4-L5: A mild broad-based disc bulge is present. No focal stenosis is present. L5-S1: No significant disc herniation. No spinal canal stenosis or neural foraminal narrowing.  IMPRESSION: 1. No acute findings. 2. Mild disc bulging at T12-L1, L2-3, and L4-5 without focal stenosis. 3. Multiple remote compression fractures. Electronically signed by: Audree Leas MD 12/05/2023 07:55 AM EDT RP Workstation: [id number]   US  ARTERIAL ABI (SCREENING LOWER EXTREMITY) Result Date: 12/04/2023 CLINICAL DATA:  Peripheral vascular disease. Former smoker. Hypertension. Hyperlipidemia. EXAM: NONINVASIVE PHYSIOLOGIC VASCULAR STUDY OF BILATERAL LOWER EXTREMITIES TECHNIQUE: Evaluation of both lower extremities were performed at rest, including calculation of ankle-brachial indices with single level pressure measurements and doppler recording. COMPARISON:  None available. FINDINGS: Right ABI:  Noncompressible Left ABI:  Noncompressible Right Lower Extremity: Monophasic posterior tibial and dorsalis pedis artery waveforms. Left Lower Extremity: Monophasic posterior tibial and dorsalis pedis artery waveforms. > 1.4 Non diagnostic secondary to incompressible vessel calcifications (medial arterial sclerosis of Monckeberg) IMPRESSION: Nondiagnostic examination due to noncompressible, likely due to diffusely calcified arteries. Electronically Signed   By: Elester Grim M.D.   On: 12/04/2023 20:26   DG Chest Port 1 View Result Date: 12/03/2023 CLINICAL DATA:  Question of sepsis to evaluate for abnormality. Shortness of breath. Hypoxic on CPAP. EXAM: PORTABLE CHEST 1 VIEW COMPARISON:  11/16/2023 FINDINGS: Heart size and pulmonary vascularity are normal. Linear shadow over the left lung base suggesting left lower lobe consolidation or collapse. Possible small left pleural effusion. Diffuse interstitial changes throughout the lungs are similar to prior study, likely chronic fibrosis. Calcified granulomas. No pneumothorax. Mediastinal contours appear intact. Degenerative changes in the spine and shoulders. Old rib fractures. IMPRESSION: Collapse or consolidation in the left lower lung with small left pleural  effusion. Chronic interstitial changes in the lungs, likely fibrosis. Electronically Signed   By: Boyce Byes M.D.   On: 12/03/2023 17:53   DG Chest Port 1 View Result Date: 11/16/2023 CLINICAL DATA:  Cough. EXAM: PORTABLE CHEST 1 VIEW COMPARISON:  Chest radiograph dated 02/18/2023. FINDINGS: Diffuse chronic coarsening. No focal consolidation, pleural effusion or pneumothorax. The cardiac silhouette is within normal limits. No acute osseous pathology. IMPRESSION: No active disease. Electronically Signed   By: Angus Bark M.D.   On: 11/16/2023 13:56   PERIPHERAL VASCULAR CATHETERIZATION Result Date: 11/12/2023 See surgical note for result.   Microbiology: Results for orders placed or performed during the hospital encounter of 12/03/23  Blood Culture (routine x 2)     Status: None   Collection Time: 12/03/23  4:43 PM   Specimen: BLOOD  Result Value Ref Range Status   Specimen Description BLOOD BLOOD RIGHT ARM  Final   Special Requests   Final    BOTTLES DRAWN AEROBIC AND ANAEROBIC Blood Culture results may not be optimal due to an inadequate volume of blood received in culture bottles   Culture   Final    NO GROWTH 5 DAYS Performed at [facility], [address] [place], [place] [address]    Report Status 12/08/2023 FINAL  Final  Resp panel by RT-PCR (RSV, Flu A&B, Covid) Anterior Nasal Swab     Status: None   Collection Time: 12/03/23  4:46 PM   Specimen: Anterior Nasal Swab  Result Value Ref Range Status   SARS Coronavirus 2 by RT PCR NEGATIVE NEGATIVE Final    Comment: (NOTE) SARS-CoV-2 target nucleic acids are NOT DETECTED.  The SARS-CoV-2 RNA is generally detectable in upper respiratory specimens during the acute phase of infection. The lowest concentration of SARS-CoV-2 viral copies this assay can detect is 138 copies/mL. A negative result does not preclude SARS-Cov-2 infection and should not be used as the sole basis for treatment or other patient  management decisions. A negative result may occur with  improper specimen collection/handling, submission of specimen other than nasopharyngeal swab, presence of viral mutation(s) within the areas targeted by this assay, and inadequate number of viral copies(<138 copies/mL). A negative result must be combined with clinical observations, patient history, and epidemiological information. The expected result is Negative.  Fact Sheet for Patients:  BloggerCourse.com  Fact Sheet for Healthcare Providers:  SeriousBroker.it  This test is no t yet approved or cleared by the [place]  FDA and  has been authorized for detection and/or diagnosis of SARS-CoV-2 by FDA under an Emergency Use Authorization (EUA). This EUA will remain  in effect (meaning this test can be used) for the duration of the COVID-19 declaration under Section 564(b)(1) of the Act, 21 U.S.C.section 360bbb-3(b)(1), unless the authorization is terminated  or revoked sooner.       Influenza A by PCR NEGATIVE NEGATIVE Final   Influenza B by PCR NEGATIVE NEGATIVE Final    Comment: (NOTE) The Xpert Xpress SARS-CoV-2/FLU/RSV plus assay is intended as an aid in the diagnosis of influenza from Nasopharyngeal swab specimens and should not be used as a sole basis for treatment. Nasal washings and aspirates are unacceptable for Xpert Xpress SARS-CoV-2/FLU/RSV testing.  Fact Sheet for Patients: BloggerCourse.com  Fact Sheet for Healthcare Providers: SeriousBroker.it  This test is not yet approved or cleared by the [place]  FDA and has been authorized for detection and/or diagnosis of SARS-CoV-2 by FDA under an Emergency Use Authorization (EUA). This EUA will remain in effect (meaning this test can be used) for the duration of the COVID-19 declaration under Section 564(b)(1) of the Act, 21 U.S.C. section 360bbb-3(b)(1),  unless the authorization is terminated or revoked.     Resp Syncytial Virus by PCR NEGATIVE NEGATIVE Final    Comment: (NOTE) Fact Sheet for Patients: BloggerCourse.com  Fact Sheet for Healthcare Providers: SeriousBroker.it  This test is not yet approved or cleared by the [place]  FDA and has been authorized for detection and/or diagnosis of SARS-CoV-2 by FDA under an Emergency Use Authorization (EUA). This EUA will remain in effect (meaning this test can be used) for the duration of the COVID-19 declaration under Section 564(b)(1) of the Act, 21 U.S.C. section 360bbb-3(b)(1), unless the authorization is terminated or revoked.  Performed at [facility], [address] [place], [place] [address]   Blood Culture (routine x 2)     Status: Abnormal   Collection Time: 12/03/23  4:48 PM   Specimen: BLOOD  Result Value Ref Range Status   Specimen Description   Final    BLOOD LEFT ANTECUBITAL Performed at [facility], [address] [place], [place] [address]    Special Requests   Final    BOTTLES DRAWN AEROBIC AND ANAEROBIC Blood Culture results may not be optimal due to an inadequate volume of blood received in culture bottles Performed at [facility], [address] [place], [place] [address]    Culture  Setup Time   Final    GRAM POSITIVE COCCI AEROBIC BOTTLE ONLY CRITICAL RESULT CALLED TO, READ BACK BY AND VERIFIED WITH: Theora Flair PATEL 12/04/23 1415 MW    Culture (A)  Final    STAPHYLOCOCCUS EPIDERMIDIS STAPHYLOCOCCUS HOMINIS THE SIGNIFICANCE OF ISOLATING THIS ORGANISM FROM A SINGLE SET OF BLOOD CULTURES WHEN MULTIPLE SETS ARE DRAWN IS UNCERTAIN. PLEASE NOTIFY THE MICROBIOLOGY DEPARTMENT WITHIN ONE WEEK IF SPECIATION AND SENSITIVITIES ARE REQUIRED. Performed at [facility], [address], [place], [place] [address]    Report Status 12/06/2023 FINAL  Final  Blood Culture ID Panel  (Reflexed)     Status: Abnormal   Collection Time: 12/03/23  4:48 PM  Result Value Ref Range Status   Enterococcus faecalis NOT DETECTED NOT DETECTED Final   Enterococcus Faecium NOT DETECTED NOT DETECTED Final   Listeria monocytogenes NOT DETECTED NOT DETECTED Final   Staphylococcus species DETECTED (A) NOT DETECTED Final    Comment: CRITICAL RESULT CALLED TO, READ BACK BY AND VERIFIED WITH: Theora Flair PATEL 12/04/23 1415 MW    Staphylococcus aureus (BCID) NOT DETECTED NOT DETECTED Final   Staphylococcus epidermidis DETECTED (A) NOT DETECTED Final    Comment: Methicillin (oxacillin) resistant coagulase negative staphylococcus. Possible blood culture contaminant (unless isolated from more than one blood culture draw or clinical case suggests pathogenicity). No antibiotic treatment is indicated for blood  culture contaminants. CRITICAL RESULT CALLED TO, READ BACK BY AND VERIFIED WITH: Theora Flair PATEL 12/04/23 1415 MW    Staphylococcus lugdunensis NOT DETECTED NOT DETECTED Final   Streptococcus species NOT DETECTED NOT DETECTED Final   Streptococcus agalactiae NOT DETECTED NOT DETECTED Final   Streptococcus pneumoniae NOT DETECTED NOT DETECTED Final   Streptococcus pyogenes NOT DETECTED NOT DETECTED Final   A.calcoaceticus-baumannii NOT DETECTED NOT DETECTED Final   Bacteroides fragilis NOT DETECTED NOT DETECTED Final   Enterobacterales NOT DETECTED NOT DETECTED Final   Enterobacter cloacae complex NOT DETECTED NOT DETECTED Final   Escherichia coli NOT DETECTED NOT DETECTED Final   Klebsiella aerogenes NOT DETECTED NOT DETECTED Final   Klebsiella oxytoca NOT DETECTED NOT DETECTED Final   Klebsiella pneumoniae NOT DETECTED NOT DETECTED Final   Proteus species NOT DETECTED NOT DETECTED Final   Salmonella species NOT DETECTED NOT DETECTED Final   Serratia marcescens NOT DETECTED NOT DETECTED Final   Haemophilus influenzae NOT DETECTED NOT DETECTED Final   Neisseria meningitidis NOT DETECTED NOT  DETECTED Final   Pseudomonas aeruginosa NOT DETECTED NOT DETECTED Final   Stenotrophomonas maltophilia NOT DETECTED NOT DETECTED Final   Candida albicans NOT DETECTED NOT DETECTED Final   Candida auris NOT DETECTED NOT DETECTED Final   Candida glabrata NOT DETECTED NOT DETECTED Final   Candida krusei NOT DETECTED NOT DETECTED Final   Candida parapsilosis NOT DETECTED NOT DETECTED Final   Candida tropicalis NOT  DETECTED NOT DETECTED Final   Cryptococcus neoformans/gattii NOT DETECTED NOT DETECTED Final   Methicillin resistance mecA/C DETECTED (A) NOT DETECTED Final    Comment: CRITICAL RESULT CALLED TO, READ BACK BY AND VERIFIED WITH: Harlie Lieu 12/04/23 1415 MW Performed at [facility], [address] [place], [place] [address]   Culture, blood (Routine X 2) w Reflex to ID Panel     Status: None   Collection Time: 12/04/23  3:30 PM   Specimen: BLOOD  Result Value Ref Range Status   Specimen Description BLOOD BLOOD RIGHT HAND  Final   Special Requests   Final    BOTTLES DRAWN AEROBIC AND ANAEROBIC Blood Culture adequate volume   Culture   Final    NO GROWTH 5 DAYS Performed at [facility], [address] [place], [place] [address]    Report Status 12/09/2023 FINAL  Final  Culture, blood (Routine X 2) w Reflex to ID Panel     Status: None   Collection Time: 12/04/23  3:38 PM   Specimen: BLOOD  Result Value Ref Range Status   Specimen Description BLOOD BLOOD LEFT HAND  Final   Special Requests   Final    BOTTLES DRAWN AEROBIC AND ANAEROBIC Blood Culture adequate volume   Culture   Final    NO GROWTH 5 DAYS Performed at [facility], [address] [place], [place] [address]    Report Status 12/09/2023 FINAL  Final    Labs: CBC: Recent Labs  Lab 12/03/23 1646 12/04/23 0535 12/05/23 0453 12/06/23 0416 12/07/23 0616  WBC 20.4* 18.9* 14.4* 10.1 8.9  NEUTROABS 14.7*  --   --   --   --   HGB 10.3* 8.8* 8.0* 7.4* 8.1*  HCT 32.8* 28.2*  27.1* 24.6* 26.3*  MCV 88.4 87.3 91.2 90.4 89.2  PLT 340 303 287 271 279   Basic Metabolic Panel: Recent Labs  Lab 12/03/23 1646 12/04/23 0535 12/05/23 0453 12/06/23 0416  NA 140 142 141 142  K 3.9 4.2 3.8 3.7  CL 102 107 108 112*  CO2 24 25 25 27   GLUCOSE 139* 199* 97 98  BUN 25* 24* 25* 27*  CREATININE 1.27* 0.91 0.89 0.80  CALCIUM  8.3* 7.9* 8.1* 7.9*  MG 2.1  --  2.2 2.1   Liver Function Tests: Recent Labs  Lab 12/03/23 1646  AST 19  ALT 8  ALKPHOS 58  BILITOT 0.7  PROT 6.9  ALBUMIN 2.9*   CBG: Recent Labs  Lab 12/04/23 0206  GLUCAP 236*    Discharge time spent: greater than 30 minutes.  Signed: Brenna Cam, MD [facility] 12/09/2023

## 2023-12-11 ENCOUNTER — Encounter: Payer: Self-pay | Admitting: Vascular Surgery

## 2023-12-14 ENCOUNTER — Ambulatory Visit (INDEPENDENT_AMBULATORY_CARE_PROVIDER_SITE_OTHER): Payer: Medicare (Managed Care) | Admitting: Nurse Practitioner

## 2023-12-14 ENCOUNTER — Encounter (INDEPENDENT_AMBULATORY_CARE_PROVIDER_SITE_OTHER): Payer: Medicare (Managed Care)

## 2024-09-02 ENCOUNTER — Ambulatory Visit: Payer: Medicare (Managed Care) | Admitting: Urology
